# Patient Record
Sex: Male | Born: 1970
Health system: Southern US, Community
[De-identification: ages and names within clinical notes are randomized; demographics above are authoritative.]

## PROBLEM LIST (undated history)

## (undated) DIAGNOSIS — G4730 Sleep apnea, unspecified: Secondary | ICD-10-CM

## (undated) DIAGNOSIS — C801 Malignant (primary) neoplasm, unspecified: Secondary | ICD-10-CM

## (undated) DIAGNOSIS — Z972 Presence of dental prosthetic device (complete) (partial): Secondary | ICD-10-CM

## (undated) DIAGNOSIS — R972 Elevated prostate specific antigen [PSA]: Secondary | ICD-10-CM

## (undated) DIAGNOSIS — K429 Umbilical hernia without obstruction or gangrene: Secondary | ICD-10-CM

## (undated) DIAGNOSIS — Z125 Encounter for screening for malignant neoplasm of prostate: Secondary | ICD-10-CM

## (undated) HISTORY — PX: PROSTATE BIOPSY: SHX241

## (undated) HISTORY — PX: LIPOSUCTION EXTREMITIES: SUR830

## (undated) HISTORY — PX: SOFT TISSUE MASS EXCISION: SHX2419

---

## 2013-06-23 DIAGNOSIS — Q539 Undescended testicle, unspecified: Secondary | ICD-10-CM

## 2013-06-23 DIAGNOSIS — G4730 Sleep apnea, unspecified: Secondary | ICD-10-CM

## 2013-06-23 HISTORY — DX: Undescended testicle, unspecified: Q53.9

## 2013-06-23 HISTORY — DX: Sleep apnea, unspecified: G47.30

## 2013-11-30 ENCOUNTER — Ambulatory Visit (HOSPITAL_BASED_OUTPATIENT_CLINIC_OR_DEPARTMENT_OTHER): Payer: 59 | Attending: Family Medicine | Admitting: Radiology

## 2013-11-30 VITALS — Ht 72.0 in | Wt 335.0 lb

## 2013-11-30 DIAGNOSIS — G4730 Sleep apnea, unspecified: Principal | ICD-10-CM

## 2013-11-30 DIAGNOSIS — G4733 Obstructive sleep apnea (adult) (pediatric): Secondary | ICD-10-CM

## 2013-11-30 DIAGNOSIS — R0989 Other specified symptoms and signs involving the circulatory and respiratory systems: Secondary | ICD-10-CM | POA: Insufficient documentation

## 2013-11-30 DIAGNOSIS — R0609 Other forms of dyspnea: Secondary | ICD-10-CM | POA: Insufficient documentation

## 2013-11-30 DIAGNOSIS — G4710 Hypersomnia, unspecified: Secondary | ICD-10-CM | POA: Insufficient documentation

## 2013-12-03 DIAGNOSIS — G4730 Sleep apnea, unspecified: Secondary | ICD-10-CM

## 2013-12-03 DIAGNOSIS — G4710 Hypersomnia, unspecified: Secondary | ICD-10-CM

## 2013-12-03 NOTE — Sleep Study (Signed)
   NAME: Lawrence RussellSteven Dalton DATE OF BIRTH:  [birth year] MEDICAL RECORD NUMBER [id number]  LOCATION: [facility]  PHYSICIAN: Venicia Vandall D  DATE OF STUDY: 11/30/2013  SLEEP STUDY TYPE: Nocturnal Polysomnogram               REFERRING PHYSICIAN: Koirala, Dibas, MD  INDICATION FOR STUDY: Hypersomnia with sleep apnea  EPWORTH SLEEPINESS SCORE:   11/24 HEIGHT: 6' (182.9 cm)  WEIGHT: 335 lb (151.955 kg)    Body mass index is 45.42 kg/(m^2).  NECK SIZE: 19.5 in.  MEDICATIONS: Charted for review  SLEEP ARCHITECTURE: Split study protocol. During the diagnostic phase, total sleep time 125.5 minutes with sleep efficiency 83.9%. Stage I was 4.8%, stage II 79.3%, stage III absent, REM 15.9% of total sleep time. Sleep latency 17.5 minutes, REM latency 83.5 minutes, awake after sleep onset 6.5 minutes, arousal index 4.3, bedtime medication: None  RESPIRATORY DATA: Apnea hypopnea index (AHI) 23.9 per hour. 50 total events scored including 15 obstructive apneas, 1 central apnea, 34 hypopneas. All events were while supine. REM AHI 87 per hour. CPAP was titrated to 10 CWP, AHI 0 per hour. He wore a large fullface mask.  OXYGEN DATA: Moderate snoring before CPAP with oxygen desaturation to a nadir of 78% on room air. With CPAP control, snoring was prevented and mean oxygen saturation of 97.9% on room air.  CARDIAC DATA: Normal sinus rhythm  MOVEMENT/PARASOMNIA: No significant movement disturbance, bathroom x1  IMPRESSION/ RECOMMENDATION:   1) Moderate obstructive sleep apnea/hypopnea syndrome, AHI 23.9 per hour with supine events. Moderate snoring with oxygen desaturation to a nadir of 78% on room air. 2) Successful CPAP titration to 10 CWP, AHI 0 per hour. He wore a large ResMed AirFit F10 fullface mask with heated humidifier. Snoring was prevented and mean oxygen saturation held at 97.9% on room air with CPAP.   Signed Jetty Duhamellinton Nakenya Theall M.D. Waymon BudgeYOUNG,Shemia Bevel D Diplomate, American Board of  Sleep Medicine  ELECTRONICALLY SIGNED ON:  12/03/2013, 12:37 PM [facility] PH: [phone]   FX: [phone] ACCREDITED BY THE AMERICAN ACADEMY OF SLEEP MEDICINE

## 2014-01-17 ENCOUNTER — Institutional Professional Consult (permissible substitution): Payer: 59 | Admitting: Internal Medicine

## 2014-02-03 ENCOUNTER — Telehealth: Payer: Self-pay | Admitting: Internal Medicine

## 2014-02-03 NOTE — Telephone Encounter (Signed)
lmomtcb x1 

## 2014-02-03 NOTE — Telephone Encounter (Signed)
Called spoke with pt. Aware nothing sooner available. Nothing further needed

## 2014-03-02 ENCOUNTER — Encounter: Payer: Self-pay | Admitting: Internal Medicine

## 2014-03-02 ENCOUNTER — Encounter (INDEPENDENT_AMBULATORY_CARE_PROVIDER_SITE_OTHER): Payer: Self-pay

## 2014-03-02 ENCOUNTER — Ambulatory Visit (INDEPENDENT_AMBULATORY_CARE_PROVIDER_SITE_OTHER): Payer: 59 | Admitting: Internal Medicine

## 2014-03-02 VITALS — BP 130/82 | HR 86 | Ht 72.0 in | Wt 354.0 lb

## 2014-03-02 DIAGNOSIS — G4733 Obstructive sleep apnea (adult) (pediatric): Secondary | ICD-10-CM | POA: Insufficient documentation

## 2014-03-02 DIAGNOSIS — Z9989 Dependence on other enabling machines and devices: Secondary | ICD-10-CM | POA: Insufficient documentation

## 2014-03-02 NOTE — Patient Instructions (Signed)
Order- New DME, new CPAP 10 cwp, mask of choice, humidifier, supplies  Dx OSA  Please call as needed

## 2014-03-02 NOTE — Assessment & Plan Note (Signed)
Moderate obstructive sleep apnea with good response to CPAP during titration. He is looking forward to starting CPAP at home, recognizing he slept better during his study night. We had a careful discussion of CPAP physiology, medical concerns, importance of weight and responsibility to drive safely, treatment options  Plan-start CPAP at 10 CWP

## 2014-03-02 NOTE — Progress Notes (Signed)
03/02/14- 43 yoM never smoker referred courtesy of Dr Docia Chuck -pt had sleep study done at [facility] back june 2015.  pt c/o snoring at night NPSG 11/30/13- moderate OSA, AHI 23.9/ hr, CPAP to 10/ AHI 0, weight 335 lbs. 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 Avoids caffeine which over stimulates. No history ENT surgery, heart or lung disease. Father is on CPAP. Bedtime 9-11 PM, sleep latency 5-10 minutes, waking 2-3 times before up anywhere between 3 AM and 7 AM. Weight has gone up 20 pounds.  . Prior to Admission medications   Not on File   No past medical history on file. Past Surgical History  Procedure Laterality Date  . Fatty tumor removed      at age 83   Family History  Problem Relation Age of Onset  . Asthma Maternal Aunt   . Asthma Maternal Grandmother    History   Social History  . Marital Status: Married    Spouse Name: N/A    Number of Children: 0  . Years of Education: N/A   Occupational History  . Not on file.   Social History Main Topics  . Smoking status: Never Smoker   . Smokeless tobacco: Never Used  . Alcohol Use: Yes     Comment: social use  . Drug Use: No  . Sexual Activity: Not on file   Other Topics Concern  . Not on file   Social History Narrative  . No narrative on file   ROS-see HPI Constitutional:   No-   weight loss, night sweats, fevers, chills, +fatigue, lassitude. HEENT:   No-  headaches, difficulty swallowing, tooth/dental problems, sore throat,       No-  sneezing, itching, ear ache, nasal congestion, post nasal drip,  CV:  No-   chest pain, orthopnea, PND, swelling in lower extremities, anasarca,                                  dizziness, palpitations Resp: No-   shortness of breath with exertion or at rest.              No-   productive cough,  No non-productive cough,  No- coughing up of blood.              No-   change in color of mucus.  No- wheezing.   Skin: No-   rash or  lesions. GI:  No-   heartburn, indigestion, abdominal pain, nausea, vomiting, diarrhea,                 change in bowel habits, loss of appetite GU: No-   dysuria, change in color of urine, no urgency or frequency.  No- flank pain. MS:  No-   joint pain or swelling.  No- decreased range of motion.  No- back pain. Neuro-     nothing unusual Psych:  No- change in mood or affect. No depression or anxiety.  No memory loss.  OBJ- Physical Exam General- Alert, Oriented, Affect-appropriate, Distress- none acute, big man/ overweight Skin- rash-none, lesions- none, excoriation- none Lymphadenopathy- none Head- atraumatic            Eyes- Gross vision intact, PERRLA, conjunctivae and secretions clear            Ears- Hearing, canals-normal  Nose- Clear, no-Septal dev, mucus, polyps, erosion, perforation             Throat- Mallampati III , mucosa clear , drainage- none, tonsils+Present Neck- flexible , trachea midline, no stridor , thyroid nl, carotid no bruit Chest - symmetrical excursion , unlabored           Heart/CV- RRR , no murmur , no gallop  , no rub, nl s1 s2                           - JVD- none , edema- none, stasis changes- none, varices- none           Lung- clear to P&A, wheeze- none, cough- none , dullness-none, rub- none           Chest wall-  Abd- tender-no, distended-no, bowel sounds-present, HSM- no Br/ Gen/ Rectal- Not done, not indicated Extrem- cyanosis- none, clubbing, none, atrophy- none, strength- nl Neuro- grossly intact to observation

## 2014-03-03 ENCOUNTER — Telehealth: Payer: Self-pay | Admitting: Internal Medicine

## 2014-03-03 NOTE — Telephone Encounter (Signed)
lmtcb

## 2014-03-07 NOTE — Telephone Encounter (Signed)
Message Received: Today     Melissa Stenson Ronny Bacon, [name]            Katina Dung.  The pt's wife called our office on 9/11 to inquire about a CPAP order. We had no record of it so Barbara Cower looked in [place] and found the referral and saw that Bjorn Loser documented that the referral was sent to me. However, I never got an In [employer] from [place]. Barbara Cower went ahead and pulled the order and all supporting documentation so we could go ahead and service the pt. He just called in to let you guys know and make sure everything was ok.  Let me know if you have questions.  Thanks!  Melissa

## 2014-03-07 NOTE — Telephone Encounter (Signed)
Lawrence Dalton not available at this time; I have sent staff message to [facility] to assist in this matter.

## 2014-05-29 ENCOUNTER — Ambulatory Visit (INDEPENDENT_AMBULATORY_CARE_PROVIDER_SITE_OTHER): Payer: 59 | Admitting: Internal Medicine

## 2014-05-29 ENCOUNTER — Encounter: Payer: Self-pay | Admitting: Internal Medicine

## 2014-05-29 VITALS — BP 120/82 | HR 83 | Ht 72.0 in | Wt 363.0 lb

## 2014-05-29 DIAGNOSIS — G4733 Obstructive sleep apnea (adult) (pediatric): Secondary | ICD-10-CM

## 2014-05-29 NOTE — Patient Instructions (Signed)
You are off to a great start   We can continue CPAP 10/ Advanced. Call them with any maintenance problems.  Please call us if you need our help

## 2014-05-29 NOTE — Progress Notes (Signed)
03/02/14- 43 yoM never smoker referred courtesy of Dr Docia ChuckKoirala -pt [facility].  pt c/o snoring at night NPSG 11/30/13- moderate OSA, AHI 23.9/ hr, CPAP to 10/ AHI 0, weight 335 lbs. 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 Avoids caffeine which over stimulates. No history ENT surgery, heart or lung disease. Father is on CPAP. Bedtime 9-11 PM, sleep latency 5-10 minutes, waking 2-3 times before up anywhere between 3 AM and 7 AM. Weight has gone up 20 pounds.  05/29/14- 43 yoM never smoker followed for OSA (DOT), complicated by morbid obesity FOLLOW FOR:  OSA; setting is on 10cwp  Advanced; mask fitting fine; no complaints He admits he sleeps better with CPAP and is no longer being told that he snores. Trying to lose weight.  ROS-see HPI Constitutional:   No-   weight loss, night sweats, fevers, chills, +fatigue, lassitude. HEENT:   No-  headaches, difficulty swallowing, tooth/dental problems, sore throat,       No-  sneezing, itching, ear ache, nasal congestion, post nasal drip,  CV:  No-   chest pain, orthopnea, PND, swelling in lower extremities, anasarca,                                  dizziness, palpitations Resp: No-   shortness of breath with exertion or at rest.              No-   productive cough,  No non-productive cough,  No- coughing up of blood.              No-   change in color of mucus.  No- wheezing.   Skin: No-   rash or lesions. GI:  No-   heartburn, indigestion, abdominal pain, nausea, vomiting,  GU:  MS:  No-   joint pain or swelling. . Neuro-     nothing unusual Psych:  No- change in mood or affect. No depression or anxiety.  No memory loss.  OBJ- Physical Exam General- Alert, Oriented, Affect-appropriate, Distress- none acute, big man/ overweight Skin- rash-none, lesions- none, excoriation- none Lymphadenopathy- none Head- atraumatic            Eyes- Gross vision intact, PERRLA,  conjunctivae and secretions clear            Ears- Hearing, canals-normal            Nose- Clear, no-Septal dev, mucus, polyps, erosion, perforation             Throat- Mallampati III , mucosa clear , drainage- none, tonsils+Present Neck- flexible , trachea midline, no stridor , thyroid nl, carotid no bruit Chest - symmetrical excursion , unlabored           Heart/CV- RRR , no murmur , no gallop  , no rub, nl s1 s2                           - JVD- none , edema- none, stasis changes- none, varices- none           Lung- clear to P&A, wheeze- none, cough- none , dullness-none, rub- none           Chest wall-  Abd-  Br/ Gen/ Rectal- Not done, not indicated Extrem- cyanosis- none, clubbing, none, atrophy- none, strength- nl Neuro-  grossly intact to observation

## 2014-06-11 NOTE — Assessment & Plan Note (Signed)
Good compliance and control with CPAP 10/Advanced. Plan-strongly encourage his intent to lose weight using diet and exercise

## 2014-11-27 ENCOUNTER — Ambulatory Visit (INDEPENDENT_AMBULATORY_CARE_PROVIDER_SITE_OTHER): Payer: 59 | Admitting: Internal Medicine

## 2014-11-27 ENCOUNTER — Encounter (INDEPENDENT_AMBULATORY_CARE_PROVIDER_SITE_OTHER): Payer: Self-pay

## 2014-11-27 ENCOUNTER — Encounter: Payer: Self-pay | Admitting: Internal Medicine

## 2014-11-27 VITALS — BP 122/88 | HR 75 | Ht 72.0 in | Wt 353.0 lb

## 2014-11-27 DIAGNOSIS — E669 Obesity, unspecified: Secondary | ICD-10-CM | POA: Diagnosis not present

## 2014-11-27 DIAGNOSIS — G4733 Obstructive sleep apnea (adult) (pediatric): Secondary | ICD-10-CM | POA: Diagnosis not present

## 2014-11-27 NOTE — Patient Instructions (Addendum)
You are doing very well !  We can continue CPAP 10/ Advanced. If you decide you would like a script for a second machine, just let us know.   Please call if we can help

## 2014-11-27 NOTE — Progress Notes (Signed)
03/02/14- 43 yoM never smoker referred courtesy of Dr Docia ChuckKoirala -[facility].  pt c/o snoring at night NPSG 11/30/13- moderate OSA, AHI 23.9/ hr, CPAP to 10/ AHI 0, weight 335 lbs. 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 Avoids caffeine which over stimulates. No history ENT surgery, heart or lung disease. Father is on CPAP. Bedtime 9-11 PM, sleep latency 5-10 minutes, waking 2-3 times before up anywhere between 3 AM and 7 AM. Weight has gone up 20 pounds.  05/29/14- 43 yoM never smoker followed for OSA (DOT), complicated by morbid obesity FOLLOW FOR:  OSA; setting is on 10cwp  Advanced; mask fitting fine; no complaints He admits he sleeps better with CPAP and is no longer being told that he snores. Trying to lose weight.  11/27/14- 44 yoM never smoker followed for OSA (DOT), complicated by morbid obesity CPAP 10/ Advanced Reports: CPAP- nightly ~6-8 hrs no issues with mask; recieved a new one yesterday.Marland Kitchen.  He admits he sleeps much better with CPAP. Download confirms good compliance and control. He is considering getting a second machine to use for travel. We discussed travel size CPAP devices. Full face mask.   I confirmed he is on no medication except vitamins.  ROS-see HPI Constitutional:   No-   weight loss, night sweats, fevers, chills, +fatigue, lassitude. HEENT:   No-  headaches, difficulty swallowing, tooth/dental problems, sore throat,       No-  sneezing, itching, ear ache, nasal congestion, post nasal drip,  CV:  No-   chest pain, orthopnea, PND, swelling in lower extremities, anasarca,                                  dizziness, palpitations Resp: No-   shortness of breath with exertion or at rest.              No-   productive cough,  No non-productive cough,  No- coughing up of blood.              No-   change in color of mucus.  No- wheezing.   Skin: No-   rash or lesions. GI:  No-   heartburn,  indigestion, abdominal pain, nausea, vomiting,  GU:  MS:  No-   joint pain or swelling. . Neuro-     nothing unusual Psych:  No- change in mood or affect. No depression or anxiety.  No memory loss.  OBJ- Physical Exam General- Alert, Oriented, Affect-appropriate, Distress- none acute, big man/               overweight Skin- rash-none, lesions- none, excoriation- none Lymphadenopathy- none Head- atraumatic            Eyes- Gross vision intact, PERRLA, conjunctivae and secretions clear            Ears- Hearing, canals-normal            Nose- Clear, no-Septal dev, mucus, polyps, erosion, perforation             Throat- Mallampati III , mucosa clear , drainage- none, tonsils+Present Neck- flexible , trachea midline, no stridor , thyroid nl, carotid no bruit Chest - symmetrical excursion , unlabored           Heart/CV- RRR , no murmur , no gallop  , no rub, nl s1 s2                           -  JVD- none , edema- none, stasis changes- none, varices- none           Lung- clear to P&A, wheeze- none, cough- none , dullness-none, rub- none           Chest wall-  Abd-  Br/ Gen/ Rectal- Not done, not indicated Extrem- cyanosis- none, clubbing, none, atrophy- none, strength- nl Neuro- grossly intact to observation     

## 2014-11-28 DIAGNOSIS — E669 Obesity, unspecified: Secondary | ICD-10-CM | POA: Insufficient documentation

## 2014-11-28 NOTE — Assessment & Plan Note (Signed)
Big man, but too heavy for his frame. Weight loss would reduce risk of future health problems and improve current management of sleep apnea.

## 2014-11-28 NOTE — Assessment & Plan Note (Signed)
Download confirms good compliance and control. He understands the purpose of CPAP and is considering having a second machine is a spare for travel which is reasonable.

## 2014-12-08 ENCOUNTER — Encounter: Payer: Self-pay | Admitting: Internal Medicine

## 2015-07-02 DIAGNOSIS — Z0000 Encounter for general adult medical examination without abnormal findings: Secondary | ICD-10-CM | POA: Diagnosis not present

## 2015-07-02 DIAGNOSIS — E291 Testicular hypofunction: Secondary | ICD-10-CM | POA: Diagnosis not present

## 2015-07-16 DIAGNOSIS — G4733 Obstructive sleep apnea (adult) (pediatric): Secondary | ICD-10-CM | POA: Diagnosis not present

## 2015-07-30 DIAGNOSIS — Z0000 Encounter for general adult medical examination without abnormal findings: Secondary | ICD-10-CM | POA: Diagnosis not present

## 2015-07-30 DIAGNOSIS — E291 Testicular hypofunction: Secondary | ICD-10-CM | POA: Diagnosis not present

## 2015-08-08 DIAGNOSIS — E291 Testicular hypofunction: Secondary | ICD-10-CM | POA: Diagnosis not present

## 2015-09-19 DIAGNOSIS — E291 Testicular hypofunction: Secondary | ICD-10-CM | POA: Diagnosis not present

## 2016-03-12 ENCOUNTER — Ambulatory Visit: Payer: Self-pay | Admitting: Surgery

## 2016-03-12 NOTE — H&P (Signed)
Lawrence Dalton 03/12/2016 2:53 PM Location: [facility] Patient #: [id number] DOB: 10/22/1970 Married / Language: English / Race: Black or African American Male  History of Present Illness Lawrence Dalton(Lawrence Steinbach A. [name] MD; 03/12/2016 3:12 PM) Patient words: Patient sent at the request of Dr. Docia ChuckKoirala for umbilical hernia. It been present for the last 3 months. He noted noticed a bulge there 3 months ago at his umbilicus. It is sore to touch. He denies nausea or vomiting. It slides in and out. No redness or drainage noted.  The patient is a 45 year old male.   Other Problems [name], [name]; 03/12/2016 2:53 PM) Sleep Apnea Umbilical Hernia Repair  Past Surgical History [name], [name]; 03/12/2016 2:53 PM) No pertinent past surgical history  Diagnostic Studies History [name], [name]; 03/12/2016 2:53 PM) Colonoscopy never  Allergies [name], [name]; 03/12/2016 2:53 PM) No Known Drug Allergies 03/12/2016  Medication History [name], [name]; 03/12/2016 2:53 PM) Testosterone Cypionate (100MG /ML Solution, Intramuscular) Active. Medications Reconciled  Social History [name], [place]; 03/12/2016 2:53 PM) Alcohol use Occasional alcohol use. Caffeine use Coffee. No drug use Tobacco use Never smoker.  Family History [name], [place]; 03/12/2016 2:53 PM) Alcohol Abuse Father. Breast Cancer Family Members In General. Diabetes Mellitus Family Members In General. Heart Disease Family Members In General.     Review of Systems [name] [name]; 03/12/2016 2:53 PM) General Not Present- Appetite Loss, Chills, Fatigue, Fever, Night Sweats, Weight Gain and Weight Loss. Skin Not Present- Change in Wart/Mole, Dryness, Hives, Jaundice, New Lesions, Non-Healing Wounds, Rash and Ulcer. HEENT Present- Wears glasses/contact lenses. Not Present- Earache, Hearing Loss, Hoarseness, Nose Bleed, Oral Ulcers, Ringing in the Ears, Seasonal Allergies, Sinus Pain, Sore Throat, Visual  Disturbances and Yellow Eyes. Respiratory Not Present- Bloody sputum, Chronic Cough, Difficulty Breathing, Snoring and Wheezing. Breast Not Present- Breast Mass, Breast Pain, Nipple Discharge and Skin Changes. Cardiovascular Not Present- Chest Pain, Difficulty Breathing Lying Down, Leg Cramps, Palpitations, Rapid Heart Rate, Shortness of Breath and Swelling of Extremities. Gastrointestinal Not Present- Abdominal Pain, Bloating, Bloody Stool, Change in Bowel Habits, Chronic diarrhea, Constipation, Difficulty Swallowing, Excessive gas, Gets full quickly at meals, Hemorrhoids, Indigestion, Nausea, Rectal Pain and Vomiting. Male Genitourinary Not Present- Blood in Urine, Change in Urinary Stream, Frequency, Impotence, Nocturia, Painful Urination, Urgency and Urine Leakage. Musculoskeletal Not Present- Back Pain, Joint Pain, Joint Stiffness, Muscle Pain, Muscle Weakness and Swelling of Extremities. Neurological Not Present- Decreased Memory, Fainting, Headaches, Numbness, Seizures, Tingling, Tremor, Trouble walking and Weakness. Psychiatric Not Present- Anxiety, Bipolar, Change in Sleep Pattern, Depression, Fearful and Frequent crying. Endocrine Not Present- Cold Intolerance, Excessive Hunger, Hair Changes, Heat Intolerance, Hot flashes and New Diabetes. Hematology Not Present- Blood Thinners, Easy Bruising, Excessive bleeding, Gland problems, HIV and Persistent Infections.  Vitals [name] [name]; 03/12/2016 2:54 PM) 03/12/2016 2:53 PM Weight: 351 lb Height: 72in Body Surface Area: 2.71 m Body Mass Index: 47.6 kg/m  Temp.: 98.66F(Temporal)  Pulse: 91 (Regular)  BP: 138/78 (Sitting, Left Arm, Standard)      Physical Exam (Lenaya Pietsch A. Aydian Dimmick MD; 03/12/2016 3:14 PM)  Chest and Lung Exam Chest and lung exam reveals -quiet, even and easy respiratory effort with no use of accessory muscles and on auscultation, normal breath sounds, no adventitious sounds and normal vocal  resonance. Inspection Chest Wall - Normal. Back - normal.  Cardiovascular Cardiovascular examination reveals -normal heart sounds, regular rate and rhythm with no murmurs and normal pedal pulses bilaterally.  Abdomen Inspection Skin - Scar - no  surgical scars. Hernias - Ventral - Reducible. Umbilical hernia - Reducible. Palpation/Percussion Palpation and Percussion of the abdomen reveal - Soft, Non Tender, No Rebound tenderness, No Rigidity (guarding) and No hepatosplenomegaly. Auscultation Auscultation of the abdomen reveals - Bowel sounds normal.  Neurologic Neurologic evaluation reveals -alert and oriented x 3 with no impairment of recent or remote memory. Mental Status-Normal.  Musculoskeletal Normal Exam - Left-Upper Extremity Strength Normal and Lower Extremity Strength Normal. Normal Exam - Right-Upper Extremity Strength Normal, Lower Extremity Weakness.    Assessment & Plan (Marqueta Pulley A. Jakirah Zaun MD; 03/12/2016 3:13 PM)  UMBILICAL HERNIA WITHOUT OBSTRUCTION AND WITHOUT GANGRENE (K42.9) Impression: Discussed repair versus observation. Discussed the need for mesh use in the potential complications of that with him today.  The risk of hernia repair include bleeding, infection, organ injury, bowel injury, bladder injury, nerve injury recurrent hernia, blood clots, worsening of underlying condition, chronic pain, mesh use, open surgery, death, and the need for other operattions. Pt agrees to proceed  Current Plans You are being scheduled for surgery - Our schedulers will call you.  You should hear from our office's scheduling department within 5 working days about the location, date, and time of surgery. We try to make accommodations for patient's preferences in scheduling surgery, but sometimes the OR schedule or the surgeon's schedule prevents us from making those accommodations.  If you have not heard from our office [phone]) in 5 working days, call the office  and ask for your surgeon's nurse.  If you have other questions about your diagnosis, plan, or surgery, call the office and ask for your surgeon's nurse.  Pt Education - Pamphlet Given - Hernia Surgery: discussed with patient and provided information. The anatomy & physiology of the abdominal wall was discussed. The pathophysiology of hernias was discussed. Natural history risks without surgery including progeressive enlargement, pain, incarceration, & strangulation was discussed. Contributors to complications such as smoking, obesity, diabetes, prior surgery, etc were discussed.  I feel the risks of no intervention will lead to serious problems that outweigh the operative risks; therefore, I recommended surgery to reduce and repair the hernia. I explained laparoscopic techniques with possible need for an open approach. I noted the probable use of mesh to patch and/or buttress the hernia repair  Risks such as bleeding, infection, abscess, need for further treatment, heart attack, death, and other risks were discussed. I noted a good likelihood this will help address the problem. Goals of post-operative recovery were discussed as well. Possibility that this will not correct all symptoms was explained. I stressed the importance of low-impact activity, aggressive pain control, avoiding constipation, & not pushing through pain to minimize risk of post-operative chronic pain or injury. Possibility of reherniation especially with smoking, obesity, diabetes, immunosuppression, and other health conditions was discussed. We will work to minimize complications.  An educational handout further explaining the pathology & treatment options was given as well. Questions were answered. The patient expresses understanding & wishes to proceed with surgery.

## 2016-03-23 DIAGNOSIS — K429 Umbilical hernia without obstruction or gangrene: Secondary | ICD-10-CM

## 2016-03-23 HISTORY — DX: Umbilical hernia without obstruction or gangrene: K42.9

## 2016-04-03 ENCOUNTER — Encounter (HOSPITAL_BASED_OUTPATIENT_CLINIC_OR_DEPARTMENT_OTHER): Payer: Self-pay | Admitting: *Deleted

## 2016-04-03 NOTE — Pre-Procedure Instructions (Signed)
To come for CBC, diff, CMET and anesthesia airway evaluation.

## 2016-04-07 ENCOUNTER — Encounter (HOSPITAL_BASED_OUTPATIENT_CLINIC_OR_DEPARTMENT_OTHER)
Admission: RE | Admit: 2016-04-07 | Discharge: 2016-04-07 | Disposition: A | Discharge status: Home / Self Care | Payer: 59 | Source: Ambulatory Visit | Attending: Surgery | Admitting: Surgery

## 2016-04-07 DIAGNOSIS — Z01812 Encounter for preprocedural laboratory examination: Secondary | ICD-10-CM | POA: Insufficient documentation

## 2016-04-07 DIAGNOSIS — K429 Umbilical hernia without obstruction or gangrene: Secondary | ICD-10-CM | POA: Insufficient documentation

## 2016-04-07 LAB — CBC WITH DIFFERENTIAL/PLATELET
BASOS ABS: 0 10*3/uL (ref 0.0–0.1)
BASOS PCT: 1 %
Eosinophils Absolute: 0.2 10*3/uL (ref 0.0–0.7)
Eosinophils Relative: 3 %
HEMATOCRIT: 48.1 % (ref 39.0–52.0)
HEMOGLOBIN: 16.3 g/dL (ref 13.0–17.0)
Lymphocytes Relative: 45 %
Lymphs Abs: 2.3 10*3/uL (ref 0.7–4.0)
MCH: 28.2 pg (ref 26.0–34.0)
MCHC: 33.9 g/dL (ref 30.0–36.0)
MCV: 83.4 fL (ref 78.0–100.0)
MONOS PCT: 11 %
Monocytes Absolute: 0.6 10*3/uL (ref 0.1–1.0)
NEUTROS ABS: 2.1 10*3/uL (ref 1.7–7.7)
NEUTROS PCT: 40 %
Platelets: 200 10*3/uL (ref 150–400)
RBC: 5.77 MIL/uL (ref 4.22–5.81)
RDW: 15.3 % (ref 11.5–15.5)
WBC: 5.1 10*3/uL (ref 4.0–10.5)

## 2016-04-07 LAB — COMPREHENSIVE METABOLIC PANEL
ALBUMIN: 3.7 g/dL (ref 3.5–5.0)
ALK PHOS: 54 U/L (ref 38–126)
ALT: 19 U/L (ref 17–63)
AST: 20 U/L (ref 15–41)
Anion gap: 7 (ref 5–15)
BILIRUBIN TOTAL: 0.6 mg/dL (ref 0.3–1.2)
BUN: 12 mg/dL (ref 6–20)
CALCIUM: 9 mg/dL (ref 8.9–10.3)
CO2: 24 mmol/L (ref 22–32)
Chloride: 104 mmol/L (ref 101–111)
Creatinine, Ser: 1.26 mg/dL — ABNORMAL HIGH (ref 0.61–1.24)
GFR calc Af Amer: >60 mL/min (ref >60)
GFR calc non Af Amer: >60 mL/min (ref >60)
GLUCOSE: 77 mg/dL (ref 65–99)
Potassium: 4.8 mmol/L (ref 3.5–5.1)
SODIUM: 135 mmol/L (ref 135–145)
TOTAL PROTEIN: 7.1 g/dL (ref 6.5–8.1)

## 2016-04-07 NOTE — Progress Notes (Signed)
Anesthesia consult by Dr Michelle Piperssey no further treatment needed.  Pt instructed to bring cpap with him day of surgery

## 2016-04-10 ENCOUNTER — Ambulatory Visit (HOSPITAL_BASED_OUTPATIENT_CLINIC_OR_DEPARTMENT_OTHER): Payer: 59 | Admitting: Certified Registered"

## 2016-04-10 ENCOUNTER — Encounter (HOSPITAL_BASED_OUTPATIENT_CLINIC_OR_DEPARTMENT_OTHER): Payer: Self-pay | Admitting: *Deleted

## 2016-04-10 ENCOUNTER — Ambulatory Visit (HOSPITAL_BASED_OUTPATIENT_CLINIC_OR_DEPARTMENT_OTHER)
Admission: RE | Admit: 2016-04-10 | Discharge: 2016-04-10 | Disposition: A | Discharge status: Home / Self Care | Payer: 59 | Source: Ambulatory Visit | Attending: Surgery | Admitting: Surgery

## 2016-04-10 ENCOUNTER — Telehealth: Payer: Self-pay | Admitting: General Surgery

## 2016-04-10 ENCOUNTER — Encounter (HOSPITAL_BASED_OUTPATIENT_CLINIC_OR_DEPARTMENT_OTHER)
Admission: RE | Disposition: A | Discharge status: Home / Self Care | Payer: Self-pay | Source: Ambulatory Visit | Attending: Surgery

## 2016-04-10 DIAGNOSIS — Z833 Family history of diabetes mellitus: Secondary | ICD-10-CM | POA: Insufficient documentation

## 2016-04-10 DIAGNOSIS — Z79899 Other long term (current) drug therapy: Secondary | ICD-10-CM | POA: Diagnosis not present

## 2016-04-10 DIAGNOSIS — Z811 Family history of alcohol abuse and dependence: Secondary | ICD-10-CM | POA: Diagnosis not present

## 2016-04-10 DIAGNOSIS — Z803 Family history of malignant neoplasm of breast: Secondary | ICD-10-CM | POA: Diagnosis not present

## 2016-04-10 DIAGNOSIS — K420 Umbilical hernia with obstruction, without gangrene: Secondary | ICD-10-CM | POA: Diagnosis not present

## 2016-04-10 DIAGNOSIS — Z6841 Body Mass Index (BMI) 40.0 and over, adult: Secondary | ICD-10-CM | POA: Diagnosis not present

## 2016-04-10 DIAGNOSIS — Z8249 Family history of ischemic heart disease and other diseases of the circulatory system: Secondary | ICD-10-CM | POA: Diagnosis not present

## 2016-04-10 DIAGNOSIS — G4730 Sleep apnea, unspecified: Secondary | ICD-10-CM | POA: Diagnosis not present

## 2016-04-10 HISTORY — PX: UMBILICAL HERNIA REPAIR: SHX196

## 2016-04-10 HISTORY — DX: Presence of dental prosthetic device (complete) (partial): Z97.2

## 2016-04-10 HISTORY — PX: INSERTION OF MESH: SHX5868

## 2016-04-10 HISTORY — DX: Umbilical hernia without obstruction or gangrene: K42.9

## 2016-04-10 HISTORY — DX: Sleep apnea, unspecified: G47.30

## 2016-04-10 SURGERY — REPAIR, HERNIA, UMBILICAL, ADULT
Anesthesia: General | Site: Abdomen

## 2016-04-10 MED ORDER — CHLORHEXIDINE GLUCONATE CLOTH 2 % EX PADS: 6.0000 | MEDICATED_PAD | Freq: Once | CUTANEOUS | Status: DC

## 2016-04-10 MED ORDER — DEXAMETHASONE SODIUM PHOSPHATE 4 MG/ML IJ SOLN
INTRAMUSCULAR | Status: DC | PRN
  Administered 2016-04-10: 10 mg via INTRAVENOUS

## 2016-04-10 MED ORDER — CELECOXIB 400 MG PO CAPS
400.0000 mg | ORAL_CAPSULE | ORAL | Status: AC
  Administered 2016-04-10: 400 mg via ORAL

## 2016-04-10 MED ORDER — MIDAZOLAM HCL 5 MG/5ML IJ SOLN
INTRAMUSCULAR | Status: DC | PRN
  Administered 2016-04-10: 2 mg via INTRAVENOUS

## 2016-04-10 MED ORDER — 0.9 % SODIUM CHLORIDE (POUR BTL) OPTIME
TOPICAL | Status: DC | PRN
  Administered 2016-04-10: 200 mL

## 2016-04-10 MED ORDER — GABAPENTIN 300 MG PO CAPS
300.0000 mg | ORAL_CAPSULE | ORAL | Status: AC
  Administered 2016-04-10: 300 mg via ORAL

## 2016-04-10 MED ORDER — LACTATED RINGERS IV SOLN
INTRAVENOUS | Status: DC
  Administered 2016-04-10 (×2): via INTRAVENOUS

## 2016-04-10 MED ORDER — CEFAZOLIN SODIUM-DEXTROSE 2-4 GM/100ML-% IV SOLN
INTRAVENOUS | Status: AC
  Filled 2016-04-10: qty 200

## 2016-04-10 MED ORDER — ROCURONIUM BROMIDE 100 MG/10ML IV SOLN
INTRAVENOUS | Status: DC | PRN
  Administered 2016-04-10: 35 mg via INTRAVENOUS

## 2016-04-10 MED ORDER — PROPOFOL 10 MG/ML IV BOLUS
INTRAVENOUS | Status: AC
  Filled 2016-04-10: qty 20

## 2016-04-10 MED ORDER — ACETAMINOPHEN 500 MG PO TABS
ORAL_TABLET | ORAL | Status: AC
  Filled 2016-04-10: qty 2

## 2016-04-10 MED ORDER — SUGAMMADEX SODIUM 500 MG/5ML IV SOLN
INTRAVENOUS | Status: DC | PRN
  Administered 2016-04-10: 300 mg via INTRAVENOUS

## 2016-04-10 MED ORDER — CELECOXIB 200 MG PO CAPS
ORAL_CAPSULE | ORAL | Status: AC
  Filled 2016-04-10: qty 2

## 2016-04-10 MED ORDER — PROMETHAZINE HCL 25 MG/ML IJ SOLN: 6.2500 mg | INTRAMUSCULAR | Status: DC | PRN

## 2016-04-10 MED ORDER — FENTANYL CITRATE (PF) 100 MCG/2ML IJ SOLN
INTRAMUSCULAR | Status: AC
  Filled 2016-04-10: qty 2

## 2016-04-10 MED ORDER — BUPIVACAINE-EPINEPHRINE 0.25% -1:200000 IJ SOLN
INTRAMUSCULAR | Status: DC | PRN
Start: 2016-04-10 — End: 2016-04-10
  Administered 2016-04-10: 10 mL

## 2016-04-10 MED ORDER — DEXTROSE 5 % IV SOLN
3.0000 g | INTRAVENOUS | Status: AC
  Administered 2016-04-10: 3 g via INTRAVENOUS

## 2016-04-10 MED ORDER — FENTANYL CITRATE (PF) 100 MCG/2ML IJ SOLN
INTRAMUSCULAR | Status: DC | PRN
  Administered 2016-04-10 (×2): 100 ug via INTRAVENOUS

## 2016-04-10 MED ORDER — ACETAMINOPHEN 500 MG PO TABS
1000.0000 mg | ORAL_TABLET | ORAL | Status: AC
  Administered 2016-04-10: 1000 mg via ORAL

## 2016-04-10 MED ORDER — PROPOFOL 10 MG/ML IV BOLUS
INTRAVENOUS | Status: DC | PRN
  Administered 2016-04-10: 200 mg via INTRAVENOUS

## 2016-04-10 MED ORDER — GABAPENTIN 300 MG PO CAPS
ORAL_CAPSULE | ORAL | Status: AC
  Filled 2016-04-10: qty 1

## 2016-04-10 MED ORDER — ONDANSETRON HCL 4 MG/2ML IJ SOLN
INTRAMUSCULAR | Status: DC | PRN
  Administered 2016-04-10: 4 mg via INTRAVENOUS

## 2016-04-10 MED ORDER — HYDROMORPHONE HCL 1 MG/ML IJ SOLN
INTRAMUSCULAR | Status: AC
  Filled 2016-04-10: qty 1

## 2016-04-10 MED ORDER — HYDROMORPHONE HCL 1 MG/ML IJ SOLN
0.2500 mg | INTRAMUSCULAR | Status: DC | PRN
  Administered 2016-04-10 (×2): 0.5 mg via INTRAVENOUS

## 2016-04-10 MED ORDER — LIDOCAINE HCL (CARDIAC) 20 MG/ML IV SOLN
INTRAVENOUS | Status: DC | PRN
  Administered 2016-04-10: 100 mg via INTRAVENOUS

## 2016-04-10 MED ORDER — MIDAZOLAM HCL 2 MG/2ML IJ SOLN
INTRAMUSCULAR | Status: AC
  Filled 2016-04-10: qty 2

## 2016-04-10 MED ORDER — HYDROCODONE-ACETAMINOPHEN 10-325 MG PO TABS: 1.0000 | ORAL_TABLET | Freq: Four times a day (QID) | ORAL | 0 refills | Status: AC | PRN

## 2016-04-10 MED ORDER — SUCCINYLCHOLINE CHLORIDE 20 MG/ML IJ SOLN
INTRAMUSCULAR | Status: DC | PRN
  Administered 2016-04-10: 140 mg via INTRAVENOUS

## 2016-04-10 SURGICAL SUPPLY — 47 items
BENZOIN TINCTURE PRP APPL 2/3 (GAUZE/BANDAGES/DRESSINGS) IMPLANT
BLADE CLIPPER SURG (BLADE) ×2 IMPLANT
BLADE SURG 10 STRL SS (BLADE) IMPLANT
BLADE SURG 15 STRL LF DISP TIS (BLADE) ×1 IMPLANT
BLADE SURG 15 STRL SS (BLADE) ×1
CANISTER SUCT 1200ML W/VALVE (MISCELLANEOUS) IMPLANT
CHLORAPREP W/TINT 26ML (MISCELLANEOUS) ×2 IMPLANT
COVER BACK TABLE 60X90IN (DRAPES) ×2 IMPLANT
COVER MAYO STAND STRL (DRAPES) ×2 IMPLANT
DECANTER SPIKE VIAL GLASS SM (MISCELLANEOUS) IMPLANT
DERMABOND ADVANCED (GAUZE/BANDAGES/DRESSINGS) ×1
DERMABOND ADVANCED .7 DNX12 (GAUZE/BANDAGES/DRESSINGS) ×1 IMPLANT
DRAPE LAPAROTOMY TRNSV 102X78 (DRAPE) ×2 IMPLANT
DRAPE UTILITY XL STRL (DRAPES) ×2 IMPLANT
DRSG TEGADERM 4X4.75 (GAUZE/BANDAGES/DRESSINGS) IMPLANT
ELECT COATED BLADE 2.86 ST (ELECTRODE) ×2 IMPLANT
ELECT REM PT RETURN 9FT ADLT (ELECTROSURGICAL) ×2
ELECTRODE REM PT RTRN 9FT ADLT (ELECTROSURGICAL) ×1 IMPLANT
GLOVE BIOGEL PI IND STRL 7.0 (GLOVE) ×1 IMPLANT
GLOVE BIOGEL PI IND STRL 8 (GLOVE) ×1 IMPLANT
GLOVE BIOGEL PI INDICATOR 7.0 (GLOVE) ×1
GLOVE BIOGEL PI INDICATOR 8 (GLOVE) ×1
GLOVE ECLIPSE 6.5 STRL STRAW (GLOVE) ×2 IMPLANT
GLOVE ECLIPSE 8.0 STRL XLNG CF (GLOVE) ×2 IMPLANT
GOWN STRL REUS W/ TWL LRG LVL3 (GOWN DISPOSABLE) ×2 IMPLANT
GOWN STRL REUS W/TWL LRG LVL3 (GOWN DISPOSABLE) ×2
MESH VENTRALEX ST 1-7/10 CRC S (Mesh General) ×2 IMPLANT
NEEDLE HYPO 22GX1.5 SAFETY (NEEDLE) IMPLANT
NEEDLE HYPO 25X1 1.5 SAFETY (NEEDLE) ×2 IMPLANT
NS IRRIG 1000ML POUR BTL (IV SOLUTION) ×2 IMPLANT
PACK BASIN DAY SURGERY FS (CUSTOM PROCEDURE TRAY) ×2 IMPLANT
PENCIL BUTTON HOLSTER BLD 10FT (ELECTRODE) ×2 IMPLANT
SLEEVE SCD COMPRESS KNEE MED (MISCELLANEOUS) ×2 IMPLANT
STAPLER VISISTAT 35W (STAPLE) IMPLANT
STRIP CLOSURE SKIN 1/2X4 (GAUZE/BANDAGES/DRESSINGS) IMPLANT
SUT MON AB 4-0 PC3 18 (SUTURE) ×2 IMPLANT
SUT NOVA NAB DX-16 0-1 5-0 T12 (SUTURE) ×2 IMPLANT
SUT NOVA NAB GS-22 2 0 T19 (SUTURE) IMPLANT
SUT SILK 3 0 SH 30 (SUTURE) IMPLANT
SUT VIC AB 2-0 SH 27 (SUTURE) ×1
SUT VIC AB 2-0 SH 27XBRD (SUTURE) ×1 IMPLANT
SUT VICRYL 3-0 CR8 SH (SUTURE) ×2 IMPLANT
SYR CONTROL 10ML LL (SYRINGE) ×2 IMPLANT
TOWEL OR 17X24 6PK STRL BLUE (TOWEL DISPOSABLE) ×2 IMPLANT
TOWEL OR NON WOVEN STRL DISP B (DISPOSABLE) IMPLANT
TUBE CONNECTING 20X1/4 (TUBING) IMPLANT
YANKAUER SUCT BULB TIP NO VENT (SUCTIONS) IMPLANT

## 2016-04-10 NOTE — Transfer of Care (Signed)
Immediate Anesthesia Transfer of Care Note  Patient: Lawrence Dalton  Procedure(s) Performed: Procedure(s): HERNIA REPAIR UMBILICAL ADULT (N/A) INSERTION OF MESH (N/A)  Patient Location: PACU  Anesthesia Type:General  Level of Consciousness: sedated  Airway & Oxygen Therapy: Patient Spontanous Breathing and Patient connected to face mask oxygen  Post-op Assessment: Report given to RN and Post -op Vital signs reviewed and stable  Post vital signs: Reviewed and stable  Last Vitals:  Vitals:   04/10/16 0751  BP: 128/76  Pulse: 70  Resp: 20  Temp: 36.9 C    Last Pain:  Vitals:   04/10/16 0751  TempSrc: Oral         Complications: No apparent anesthesia complications

## 2016-04-10 NOTE — Anesthesia Preprocedure Evaluation (Addendum)
Anesthesia Evaluation  Patient identified by MRN, date of birth, ID band Patient awake    Reviewed: Allergy & Precautions, NPO status , Patient's Chart, lab work & pertinent test results  History of Anesthesia Complications Negative for: history of anesthetic complications  Airway Mallampati: III  TM Distance: >3 FB Neck ROM: Full    Dental no notable dental hx. (+) Dental Advisory Given   Pulmonary sleep apnea and Oxygen sleep apnea ,    Pulmonary exam normal        Cardiovascular negative cardio ROS Normal cardiovascular exam     Neuro/Psych negative neurological ROS  negative psych ROS   GI/Hepatic negative GI ROS, Neg liver ROS,   Endo/Other  Morbid obesity  Renal/GU      Musculoskeletal   Abdominal (+) + obese,   Peds  Hematology   Anesthesia Other Findings   Reproductive/Obstetrics                           Anesthesia Physical Anesthesia Plan  ASA: III  Anesthesia Plan: General   Post-op Pain Management:    Induction: Intravenous  Airway Management Planned: LMA and Oral ETT  Additional Equipment:   Intra-op Plan:   Post-operative Plan: Extubation in OR  Informed Consent: I have reviewed the patients History and Physical, chart, labs and discussed the procedure including the risks, benefits and alternatives for the proposed anesthesia with the patient or authorized representative who has indicated his/her understanding and acceptance.   Dental advisory given  Plan Discussed with: CRNA, Anesthesiologist and Surgeon  Anesthesia Plan Comments:        Anesthesia Quick Evaluation

## 2016-04-10 NOTE — Discharge Instructions (Signed)
[facility] _______[facility] ° °UMBILICAL OR INGUINAL HERNIA REPAIR: POST OP INSTRUCTIONS ° °Always review your discharge instruction sheet given to you by the facility where your surgery was performed. °IF YOU HAVE DISABILITY OR FAMILY LEAVE FORMS, YOU MUST BRING THEM TO THE OFFICE FOR PROCESSING.   °DO NOT GIVE THEM TO YOUR DOCTOR. ° °1. A  prescription for pain medication may be given to you upon discharge.  Take your pain medication as prescribed, if needed.  If narcotic pain medicine is not needed, then you may take acetaminophen (Tylenol) or ibuprofen (Advil) as needed. °2. Take your usually prescribed medications unless otherwise directed. °If you need a refill on your pain medication, please contact your pharmacy.  They will contact our office to request authorization. Prescriptions will not be filled after 5 pm or on week-ends. °3. You should follow a light diet the first 24 hours after arrival home, such as soup and crackers, etc.  Be sure to include lots of fluids daily.  Resume your normal diet the day after surgery. °4.Most patients will experience some swelling and bruising around the umbilicus or in the groin and scrotum.  Ice packs and reclining will help.  Swelling and bruising can take several days to resolve.  °6. It is common to experience some constipation if taking pain medication after surgery.  Increasing fluid intake and taking a stool softener (such as Colace) will usually help or prevent this problem from occurring.  A mild laxative (Milk of Magnesia or Miralax) should be taken according to package directions if there are no bowel movements after 48 hours. °7. Unless discharge instructions indicate otherwise, you may remove your bandages 24-48 hours after surgery, and you may shower at that time.  You may have steri-strips (small skin tapes) in place directly over the incision.  These strips should be left on the skin for 7-10 days.  If your surgeon used skin glue on the  incision, you may shower in 24 hours.  The glue will flake off over the next 2-3 weeks.  Any sutures or staples will be removed at the office during your follow-up visit. °8. ACTIVITIES:  You may resume regular (light) daily activities beginning the next day--such as daily self-care, walking, climbing stairs--gradually increasing activities as tolerated.  You may have sexual intercourse when it is comfortable.  Refrain from any heavy lifting or straining until approved by your doctor. ° °a.You may drive when you are no longer taking prescription pain medication, you can comfortably wear a seatbelt, and you can safely maneuver your car and apply brakes. °b.RETURN TO WORK:   °_____________________________________________ ° °9.You should see your doctor in the office for a follow-up appointment approximately 2-3 weeks after your surgery.  Make sure that you call for this appointment within a day or two after you arrive home to insure a convenient appointment time. °10.OTHER INSTRUCTIONS: _________________________ °   _____________________________________ ° °WHEN TO CALL YOUR DOCTOR: °1. Fever over 101.0 °2. Inability to urinate °3. Nausea and/or vomiting °4. Extreme swelling or bruising °5. Continued bleeding from incision. °6. Increased pain, redness, or drainage from the incision ° °The clinic staff is available to answer your questions during regular business hours.  Please don’t hesitate to call and ask to speak to one of the nurses for clinical concerns.  If you have a medical emergency, go to the nearest emergency room or call 911.  A surgeon from [facility] is always on call at the hospital ° ° °  [address], [address], [place], [place]  [address] ? ° [address] [address], [place], [place]   [address] °[phone] ? [phone] ? FAX [phone] °Web site: [email or web address] ° ° ° ° ° °Post Anesthesia Home Care Instructions ° °Activity: °Get plenty of rest for the remainder of the  day. A responsible adult should stay with you for 24 hours following the procedure.  °For the next 24 hours, DO NOT: °-Drive a car °-Operate machinery °-Drink alcoholic beverages °-Take any medication unless instructed by your physician °-Make any legal decisions or sign important papers. ° °Meals: °Start with liquid foods such as gelatin or soup. Progress to regular foods as tolerated. Avoid greasy, spicy, heavy foods. If nausea and/or vomiting occur, drink only clear liquids until the nausea and/or vomiting subsides. Call your physician if vomiting continues. ° °Special Instructions/Symptoms: °Your throat may feel dry or sore from the anesthesia or the breathing tube placed in your throat during surgery. If this causes discomfort, gargle with warm salt water. The discomfort should disappear within 24 hours. ° °If you had a scopolamine patch placed behind your ear for the management of post- operative nausea and/or vomiting: ° °1. The medication in the patch is effective for 72 hours, after which it should be removed.  Wrap patch in a tissue and discard in the trash. Wash hands thoroughly with soap and water. °2. You may remove the patch earlier than 72 hours if you experience unpleasant side effects which may include dry mouth, dizziness or visual disturbances. °3. Avoid touching the patch. Wash your hands with soap and water after contact with the patch. °  ° °

## 2016-04-10 NOTE — H&P (View-Only) (Signed)
Lawrence Dalton 03/12/2016 2:53 PM Location: [facility] Patient #: [id number] DOB: 10/22/1970 Married / Language: English / Race: Black or African American Male  History of Present Illness [name] MD; 03/12/2016 3:12 PM) Patient words: Patient sent at the request of Dr. Docia ChuckKoirala for umbilical hernia. It been present for the last 3 months. He noted noticed a bulge there 3 months ago at his umbilicus. It is sore to touch. He denies nausea or vomiting. It slides in and out. No redness or drainage noted.  The patient is a 45 year old male.   Other Problems [name], [name]; 03/12/2016 2:53 PM) Sleep Apnea Umbilical Hernia Repair  Past Surgical History [name], [name]; 03/12/2016 2:53 PM) No pertinent past surgical history  Diagnostic Studies History [name], [name]; 03/12/2016 2:53 PM) Colonoscopy never  Allergies [name], [name]; 03/12/2016 2:53 PM) No Known Drug Allergies 03/12/2016  Medication History [name], [name]; 03/12/2016 2:53 PM) Testosterone Cypionate (100MG /ML Solution, Intramuscular) Active. Medications Reconciled  Social History [name][place]; 03/12/2016 2:53 PM) Alcohol use Occasional alcohol use. Caffeine use Coffee. No drug use Tobacco use Never smoker.  Family History [name], [place]; 03/12/2016 2:53 PM) Alcohol Abuse Father. Breast Cancer Family Members In General. Diabetes Mellitus Family Members In General. Heart Disease Family Members In General.     Review of Systems [name] [name]; 03/12/2016 2:53 PM) General Not Present- Appetite Loss, Chills, Fatigue, Fever, Night Sweats, Weight Gain and Weight Loss. Skin Not Present- Change in Wart/Mole, Dryness, Hives, Jaundice, New Lesions, Non-Healing Wounds, Rash and Ulcer. HEENT Present- Wears glasses/contact lenses. Not Present- Earache, Hearing Loss, Hoarseness, Nose Bleed, Oral Ulcers, Ringing in the Ears, Seasonal Allergies, Sinus Pain, Sore Throat, Visual  Disturbances and Yellow Eyes. Respiratory Not Present- Bloody sputum, Chronic Cough, Difficulty Breathing, Snoring and Wheezing. Breast Not Present- Breast Mass, Breast Pain, Nipple Discharge and Skin Changes. Cardiovascular Not Present- Chest Pain, Difficulty Breathing Lying Down, Leg Cramps, Palpitations, Rapid Heart Rate, Shortness of Breath and Swelling of Extremities. Gastrointestinal Not Present- Abdominal Pain, Bloating, Bloody Stool, Change in Bowel Habits, Chronic diarrhea, Constipation, Difficulty Swallowing, Excessive gas, Gets full quickly at meals, Hemorrhoids, Indigestion, Nausea, Rectal Pain and Vomiting. Male Genitourinary Not Present- Blood in Urine, Change in Urinary Stream, Frequency, Impotence, Nocturia, Painful Urination, Urgency and Urine Leakage. Musculoskeletal Not Present- Back Pain, Joint Pain, Joint Stiffness, Muscle Pain, Muscle Weakness and Swelling of Extremities. Neurological Not Present- Decreased Memory, Fainting, Headaches, Numbness, Seizures, Tingling, Tremor, Trouble walking and Weakness. Psychiatric Not Present- Anxiety, Bipolar, Change in Sleep Pattern, Depression, Fearful and Frequent crying. Endocrine Not Present- Cold Intolerance, Excessive Hunger, Hair Changes, Heat Intolerance, Hot flashes and New Diabetes. Hematology Not Present- Blood Thinners, Easy Bruising, Excessive bleeding, Gland problems, HIV and Persistent Infections.  Vitals [name] [name]; 03/12/2016 2:54 PM) 03/12/2016 2:53 PM Weight: 351 lb Height: 72in Body Surface Area: 2.71 m Body Mass Index: 47.6 kg/m  Temp.: 98.66F(Temporal)  Pulse: 91 (Regular)  BP: 138/78 (Sitting, Left Arm, Standard)      Physical Exam (Izsak Meir A. Verneta Hamidi MD; 03/12/2016 3:14 PM)  Chest and Lung Exam Chest and lung exam reveals -quiet, even and easy respiratory effort with no use of accessory muscles and on auscultation, normal breath sounds, no adventitious sounds and normal vocal  resonance. Inspection Chest Wall - Normal. Back - normal.  Cardiovascular Cardiovascular examination reveals -normal heart sounds, regular rate and rhythm with no murmurs and normal pedal pulses bilaterally.  Abdomen Inspection Skin - Scar - no  surgical scars. Hernias - Ventral - Reducible. Umbilical hernia - Reducible. Palpation/Percussion Palpation and Percussion of the abdomen reveal - Soft, Non Tender, No Rebound tenderness, No Rigidity (guarding) and No hepatosplenomegaly. Auscultation Auscultation of the abdomen reveals - Bowel sounds normal.  Neurologic Neurologic evaluation reveals -alert and oriented x 3 with no impairment of recent or remote memory. Mental Status-Normal.  Musculoskeletal Normal Exam - Left-Upper Extremity Strength Normal and Lower Extremity Strength Normal. Normal Exam - Right-Upper Extremity Strength Normal, Lower Extremity Weakness.    Assessment & Plan (Amogh Komatsu A. Danniel Grenz MD; 03/12/2016 3:13 PM)  UMBILICAL HERNIA WITHOUT OBSTRUCTION AND WITHOUT GANGRENE (K42.9) Impression: Discussed repair versus observation. Discussed the need for mesh use in the potential complications of that with him today.  The risk of hernia repair include bleeding, infection, organ injury, bowel injury, bladder injury, nerve injury recurrent hernia, blood clots, worsening of underlying condition, chronic pain, mesh use, open surgery, death, and the need for other operattions. Pt agrees to proceed  Current Plans You are being scheduled for surgery - Our schedulers will call you.  You should hear from our office's scheduling department within 5 working days about the location, date, and time of surgery. We try to make accommodations for patient's preferences in scheduling surgery, but sometimes the OR schedule or the surgeon's schedule prevents us from making those accommodations.  If you have not heard from our office [phone]) in 5 working days, call the office  and ask for your surgeon's nurse.  If you have other questions about your diagnosis, plan, or surgery, call the office and ask for your surgeon's nurse.  Pt Education - Pamphlet Given - Hernia Surgery: discussed with patient and provided information. The anatomy & physiology of the abdominal wall was discussed. The pathophysiology of hernias was discussed. Natural history risks without surgery including progeressive enlargement, pain, incarceration, & strangulation was discussed. Contributors to complications such as smoking, obesity, diabetes, prior surgery, etc were discussed.  I feel the risks of no intervention will lead to serious problems that outweigh the operative risks; therefore, I recommended surgery to reduce and repair the hernia. I explained laparoscopic techniques with possible need for an open approach. I noted the probable use of mesh to patch and/or buttress the hernia repair  Risks such as bleeding, infection, abscess, need for further treatment, heart attack, death, and other risks were discussed. I noted a good likelihood this will help address the problem. Goals of post-operative recovery were discussed as well. Possibility that this will not correct all symptoms was explained. I stressed the importance of low-impact activity, aggressive pain control, avoiding constipation, & not pushing through pain to minimize risk of post-operative chronic pain or injury. Possibility of reherniation especially with smoking, obesity, diabetes, immunosuppression, and other health conditions was discussed. We will work to minimize complications.  An educational handout further explaining the pathology & treatment options was given as well. Questions were answered. The patient expresses understanding & wishes to proceed with surgery.

## 2016-04-10 NOTE — Anesthesia Postprocedure Evaluation (Signed)
Anesthesia Post Note  Patient: Lawrence RussellSteven Dalton  Procedure(s) Performed: Procedure(s) (LRB): HERNIA REPAIR UMBILICAL ADULT (N/A) INSERTION OF MESH (N/A)  Patient location during evaluation: PACU Anesthesia Type: General Level of consciousness: sedated Pain management: pain level controlled Vital Signs Assessment: post-procedure vital signs reviewed and stable Respiratory status: spontaneous breathing and respiratory function stable Cardiovascular status: stable Anesthetic complications: no    Last Vitals:  Vitals:   04/10/16 1115 04/10/16 1130  BP: (!) 142/82 (!) 143/90  Pulse: 87 88  Resp: 14 14  Temp:      Last Pain:  Vitals:   04/10/16 1130  TempSrc:   PainSc: 2                  Lashonne Shull DANIEL

## 2016-04-10 NOTE — Telephone Encounter (Signed)
Patient's wife called stating patient has vomited twice after surgery today. I recommended that she keep the patient nothing by mouth tonight. If he is still having vomiting in the morning, she will call the office.

## 2016-04-10 NOTE — Interval H&P Note (Signed)
History and Physical Interval Note:  04/10/2016 9:16 AM  Lawrence RussellSteven Dalton  has presented today for surgery, with the diagnosis of UMBILICAL HERNIA  The various methods of treatment have been discussed with the patient and family. After consideration of risks, benefits and other options for treatment, the patient has consented to  Procedure(s): HERNIA REPAIR UMBILICAL ADULT (N/A) INSERTION OF MESH (N/A) as a surgical intervention .  The patient's history has been reviewed, patient examined, no change in status, stable for surgery.  I have reviewed the patient's chart and labs.  Questions were answered to the patient's satisfaction.     Jonia Oakey A.

## 2016-04-10 NOTE — Anesthesia Procedure Notes (Signed)
Procedure Name: Intubation Date/Time: 04/10/2016 9:34 AM Performed by: Burna CashONRAD, Nickalous Stingley C Pre-anesthesia Checklist: Patient identified, Emergency Drugs available, Suction available and Patient being monitored Patient Re-evaluated:Patient Re-evaluated prior to inductionOxygen Delivery Method: Circle system utilized Preoxygenation: Pre-oxygenation with 100% oxygen Intubation Type: IV induction Ventilation: Two handed mask ventilation required Tube type: Oral Tube size: 8.0 mm Number of attempts: 1 Airway Equipment and Method: Stylet and Oral airway Placement Confirmation: ETT inserted through vocal cords under direct vision,  positive ETCO2 and breath sounds checked- equal and bilateral Secured at: 22 cm Tube secured with: Tape Dental Injury: Teeth and Oropharynx as per pre-operative assessment

## 2016-04-10 NOTE — Op Note (Signed)
Lawrence Dalton [birth year] [id number] 04/10/2016  Preoperative diagnosis: umbilical hernia   Postoperative diagnosis: incarcerated umbilical hernia   Procedure: Umbilical Hernia Repair with 4  cm  Ventralight mesh  Surgeon: Dortha Schwalbehomas A Xzavian Semmel, MD, FACS  Anesthesia: General and local    Clinical History and Indications: Pt presents for repair of painful umbilical hernia.  The risk of hernia repair include bleeding,  Infection,   Recurrence of the hernia,  Mesh use, chronic pain,  Organ injury,  Bowel injury,  Bladder injury,   nerve injury with numbness around the incision,  Death,  and worsening of preexisting  medical problems.  The alternatives to surgery have been discussed as well..  Long term expectations of both operative and non operative treatments have been discussed.   The patient agrees to proceed.  Procedure: The patient was seen in the preoperative area and the plans for the procedure reviewed again. He  had no further questions. I marked the area of the umbilicus as the operative site. He wishes to prodeed.  The patient was taken to the operating room and after satisfactory general  anesthesia had been obtained the area was clipped as needed, prepped and draped. The timeout was performed.  I used some local anesthesia to help with postoperative pain management. This was infiltrated around the umbilical area and additional infiltrated as I worked.  A curvilinear incision was made on the inferior aspect of the umbilicus. The umbilical skin was elevated off of the hernia sac. The hernia sac was dissected free of the subcutaneous tissues.  A large piece of preperitoneal fat was reduced back into the preperitoneal space.  A 4.3 cm  Mesh was  Placed into the preperitoneal space and secured to the fascia with 1 O novofil suture.  The fascia was closed over the defect with 1 O novofil.    Once the repair was complete the incision was closed by using some 3-0 Vicryl subcutaneous and 4-0  Monocryl subcuticular sutures.  The patient tolerated the procedure well. There were no operative complications. There was minimal blood loss. All counts were correct. He was taken to the PACU in satisfactory condition.  Harriette Bouillonhomas Larae Caison, MD, FACS 04/10/2016 10:33 AM

## 2016-04-11 ENCOUNTER — Encounter (HOSPITAL_BASED_OUTPATIENT_CLINIC_OR_DEPARTMENT_OTHER): Payer: Self-pay | Admitting: Surgery

## 2018-05-14 DIAGNOSIS — E291 Testicular hypofunction: Secondary | ICD-10-CM | POA: Diagnosis not present

## 2018-05-14 DIAGNOSIS — Z0000 Encounter for general adult medical examination without abnormal findings: Secondary | ICD-10-CM | POA: Diagnosis not present

## 2018-05-14 DIAGNOSIS — Z13220 Encounter for screening for lipoid disorders: Secondary | ICD-10-CM | POA: Diagnosis not present

## 2018-05-31 DIAGNOSIS — E291 Testicular hypofunction: Secondary | ICD-10-CM | POA: Diagnosis not present

## 2018-06-21 DIAGNOSIS — D72819 Decreased white blood cell count, unspecified: Secondary | ICD-10-CM | POA: Diagnosis not present

## 2018-10-27 DIAGNOSIS — H2513 Age-related nuclear cataract, bilateral: Secondary | ICD-10-CM | POA: Diagnosis not present

## 2018-10-27 DIAGNOSIS — H25013 Cortical age-related cataract, bilateral: Secondary | ICD-10-CM | POA: Diagnosis not present

## 2018-10-27 DIAGNOSIS — H40013 Open angle with borderline findings, low risk, bilateral: Secondary | ICD-10-CM | POA: Diagnosis not present

## 2018-10-27 DIAGNOSIS — H5213 Myopia, bilateral: Secondary | ICD-10-CM | POA: Diagnosis not present

## 2018-12-06 DIAGNOSIS — G4733 Obstructive sleep apnea (adult) (pediatric): Secondary | ICD-10-CM | POA: Diagnosis not present

## 2018-12-08 DIAGNOSIS — E291 Testicular hypofunction: Secondary | ICD-10-CM | POA: Diagnosis not present

## 2018-12-14 DIAGNOSIS — E291 Testicular hypofunction: Secondary | ICD-10-CM | POA: Diagnosis not present

## 2019-01-08 DIAGNOSIS — Z20828 Contact with and (suspected) exposure to other viral communicable diseases: Secondary | ICD-10-CM | POA: Diagnosis not present

## 2019-01-25 DIAGNOSIS — R030 Elevated blood-pressure reading, without diagnosis of hypertension: Secondary | ICD-10-CM | POA: Diagnosis not present

## 2019-01-25 DIAGNOSIS — R635 Abnormal weight gain: Secondary | ICD-10-CM | POA: Diagnosis not present

## 2019-06-03 DIAGNOSIS — Z0000 Encounter for general adult medical examination without abnormal findings: Secondary | ICD-10-CM | POA: Diagnosis not present

## 2019-06-09 DIAGNOSIS — E291 Testicular hypofunction: Secondary | ICD-10-CM | POA: Diagnosis not present

## 2019-06-09 DIAGNOSIS — Z125 Encounter for screening for malignant neoplasm of prostate: Secondary | ICD-10-CM | POA: Diagnosis not present

## 2019-06-14 DIAGNOSIS — E291 Testicular hypofunction: Secondary | ICD-10-CM | POA: Diagnosis not present

## 2019-06-30 DIAGNOSIS — Z131 Encounter for screening for diabetes mellitus: Secondary | ICD-10-CM | POA: Diagnosis not present

## 2019-06-30 DIAGNOSIS — E7800 Pure hypercholesterolemia, unspecified: Secondary | ICD-10-CM | POA: Diagnosis not present

## 2019-06-30 DIAGNOSIS — Z0000 Encounter for general adult medical examination without abnormal findings: Secondary | ICD-10-CM | POA: Diagnosis not present

## 2019-08-08 DIAGNOSIS — H40013 Open angle with borderline findings, low risk, bilateral: Secondary | ICD-10-CM | POA: Diagnosis not present

## 2019-11-07 DIAGNOSIS — H5213 Myopia, bilateral: Secondary | ICD-10-CM | POA: Diagnosis not present

## 2019-11-07 DIAGNOSIS — H40013 Open angle with borderline findings, low risk, bilateral: Secondary | ICD-10-CM | POA: Diagnosis not present

## 2019-12-06 DIAGNOSIS — Z125 Encounter for screening for malignant neoplasm of prostate: Secondary | ICD-10-CM | POA: Diagnosis not present

## 2019-12-06 DIAGNOSIS — E291 Testicular hypofunction: Secondary | ICD-10-CM | POA: Diagnosis not present

## 2019-12-13 DIAGNOSIS — E291 Testicular hypofunction: Secondary | ICD-10-CM | POA: Diagnosis not present

## 2019-12-24 DIAGNOSIS — Z20822 Contact with and (suspected) exposure to covid-19: Secondary | ICD-10-CM | POA: Diagnosis not present

## 2019-12-26 DIAGNOSIS — Z20822 Contact with and (suspected) exposure to covid-19: Secondary | ICD-10-CM | POA: Diagnosis not present

## 2019-12-26 DIAGNOSIS — Z9989 Dependence on other enabling machines and devices: Secondary | ICD-10-CM | POA: Diagnosis not present

## 2019-12-26 DIAGNOSIS — G4733 Obstructive sleep apnea (adult) (pediatric): Secondary | ICD-10-CM | POA: Diagnosis not present

## 2019-12-29 DIAGNOSIS — Z9989 Dependence on other enabling machines and devices: Secondary | ICD-10-CM | POA: Diagnosis not present

## 2019-12-29 DIAGNOSIS — G4733 Obstructive sleep apnea (adult) (pediatric): Secondary | ICD-10-CM | POA: Diagnosis not present

## 2020-01-08 DIAGNOSIS — Z20822 Contact with and (suspected) exposure to covid-19: Secondary | ICD-10-CM | POA: Diagnosis not present

## 2020-01-10 DIAGNOSIS — G4733 Obstructive sleep apnea (adult) (pediatric): Secondary | ICD-10-CM | POA: Diagnosis not present

## 2020-02-29 DIAGNOSIS — Z9989 Dependence on other enabling machines and devices: Secondary | ICD-10-CM | POA: Diagnosis not present

## 2020-02-29 DIAGNOSIS — G4733 Obstructive sleep apnea (adult) (pediatric): Secondary | ICD-10-CM | POA: Diagnosis not present

## 2020-03-01 ENCOUNTER — Other Ambulatory Visit (HOSPITAL_COMMUNITY): Payer: Self-pay | Admitting: General Surgery

## 2020-03-01 ENCOUNTER — Other Ambulatory Visit: Payer: Self-pay | Admitting: General Surgery

## 2020-03-10 DIAGNOSIS — F5089 Other specified eating disorder: Secondary | ICD-10-CM | POA: Diagnosis not present

## 2020-03-13 ENCOUNTER — Other Ambulatory Visit: Payer: Self-pay

## 2020-03-13 ENCOUNTER — Other Ambulatory Visit (HOSPITAL_COMMUNITY): Payer: Self-pay | Admitting: General Surgery

## 2020-03-13 ENCOUNTER — Ambulatory Visit (HOSPITAL_COMMUNITY)
Admission: RE | Admit: 2020-03-13 | Discharge: 2020-03-13 | Disposition: A | Discharge status: Home / Self Care | Payer: BC Managed Care – PPO | Source: Ambulatory Visit | Attending: General Surgery | Admitting: General Surgery

## 2020-03-13 DIAGNOSIS — Z01818 Encounter for other preprocedural examination: Secondary | ICD-10-CM | POA: Diagnosis not present

## 2020-03-14 DIAGNOSIS — E291 Testicular hypofunction: Secondary | ICD-10-CM | POA: Diagnosis not present

## 2020-03-30 DIAGNOSIS — G4733 Obstructive sleep apnea (adult) (pediatric): Secondary | ICD-10-CM | POA: Diagnosis not present

## 2020-03-30 DIAGNOSIS — Z9989 Dependence on other enabling machines and devices: Secondary | ICD-10-CM | POA: Diagnosis not present

## 2020-04-02 ENCOUNTER — Encounter: Payer: BC Managed Care – PPO | Attending: General Surgery | Admitting: Skilled Nursing Facility1

## 2020-04-02 ENCOUNTER — Encounter: Payer: Self-pay | Admitting: Skilled Nursing Facility1

## 2020-04-02 ENCOUNTER — Other Ambulatory Visit: Payer: Self-pay

## 2020-04-02 DIAGNOSIS — E669 Obesity, unspecified: Secondary | ICD-10-CM | POA: Diagnosis not present

## 2020-04-02 NOTE — Progress Notes (Signed)
Nutrition Assessment for Bariatric Surgery Medical Nutrition Therapy Appt Start Time: 2:07    End Time: 3:05  Patient was seen on 04/02/2020 for Pre-Operative Nutrition Assessment. Letter of approval faxed to [facility] bariatric surgery program coordinator on 04/02/2020  Referral stated Supervised Weight Loss (SWL) visits needed: 0  Planned surgery: sleeve gastrectomy  Pt expectation of surgery: to lose weight Pt expectation of dietitian: to guide    NUTRITION ASSESSMENT   Anthropometrics  Start weight at [facility]: 367.7 lbs (date: 04/02/2020)  Height: 72 in BMI: 49.87 kg/m2     Clinical  Medical hx: sleep apnea Medications:   Labs:  Notable signs/symptoms: back pain Any previous deficiencies? No  Micronutrient Nutrition Focused Physical Exam: Hair: No issues observed Eyes: No issues observed Mouth: No issues observed Neck: No issues observed Nails: No issues observed Skin: No issues observed  Lifestyle & Dietary Hx  Pt states he does not drink soda or eat beef. Pt states he understands he needs to eat until satisfaction not fullness and understands he needs to choose healthier foods stating he has been trying more vegan foods which he has really enjoyed.    24-Hr Dietary Recall First Meal: grapes and nuts Snack: grapes and nuts Second Meal: grapes and nuts or fast food Snack:  Third Meal: mac n cheese + pork Snack: yogurt Beverages: water   Estimated Energy Needs Calories: 2000   NUTRITION DIAGNOSIS  Overweight/obesity ([place]-3.3) related to past poor dietary habits and physical inactivity as evidenced by patient w/ planned sleeve gastrectomy surgery following dietary guidelines for continued weight loss.    NUTRITION INTERVENTION  Nutrition counseling (C-1) and education (E-2) to facilitate bariatric surgery goals.   Pre-Op Goals Reviewed with the Patient  Track food and beverage intake (pen and paper, MyFitness Pal, Baritastic app,  etc.)  Make healthy food choices while monitoring portion sizes  Consume 3 meals per day or try to eat every 3-5 hours  Avoid concentrated sugars and fried foods  Keep sugar & fat in the single digits per serving on food labels  Practice CHEWING your food (aim for applesauce consistency)  Practice not drinking 15 minutes before, during, and 30 minutes after each meal and snack  Avoid all carbonated beverages (ex: soda, sparkling beverages)   Limit caffeinated beverages (ex: coffee, tea, energy drinks)  Avoid all sugar-sweetened beverages (ex: regular soda, sports drinks)   Avoid alcohol   Aim for 64-100 ounces of FLUID daily (with at least half of fluid intake being plain water)   Aim for at least 60-80 grams of PROTEIN daily  Look for a liquid protein source that contains ?15 g protein and ?5 g carbohydrate (ex: shakes, drinks, shots)  Make a list of non-food related activities  Physical activity is an important part of a healthy lifestyle so keep it moving! The goal is to reach 150 minutes of exercise per week, including cardiovascular and weight baring activity.  *Goals that are bolded indicate the pt would like to start working towards these  Handouts Provided Include   Bariatric Surgery handouts (Nutrition Visits, Pre-Op Goals, Protein Shakes, Vitamins & Minerals)  Learning Style & Readiness for Change Teaching method utilized: Visual & Auditory  Demonstrated degree of understanding via: Teach Back  Barriers to learning/adherence to lifestyle change: none identified    MONITORING & EVALUATION Dietary intake, weekly physical activity, body weight, and pre-op goals reached at next nutrition visit.    Next Steps  Patient is to follow up at [place]  for Pre-Op Class >2 weeks before surgery for further nutrition education.

## 2020-04-13 DIAGNOSIS — H00011 Hordeolum externum right upper eyelid: Secondary | ICD-10-CM | POA: Diagnosis not present

## 2020-04-25 ENCOUNTER — Other Ambulatory Visit: Payer: Self-pay | Admitting: Gastroenterology

## 2020-04-25 DIAGNOSIS — Z1211 Encounter for screening for malignant neoplasm of colon: Secondary | ICD-10-CM | POA: Diagnosis not present

## 2020-05-14 ENCOUNTER — Ambulatory Visit: Payer: BC Managed Care – PPO

## 2020-05-22 DIAGNOSIS — E291 Testicular hypofunction: Secondary | ICD-10-CM | POA: Diagnosis not present

## 2020-05-22 DIAGNOSIS — Z125 Encounter for screening for malignant neoplasm of prostate: Secondary | ICD-10-CM | POA: Diagnosis not present

## 2020-05-29 DIAGNOSIS — Z0000 Encounter for general adult medical examination without abnormal findings: Secondary | ICD-10-CM | POA: Diagnosis not present

## 2020-05-29 DIAGNOSIS — R7309 Other abnormal glucose: Secondary | ICD-10-CM | POA: Diagnosis not present

## 2020-05-29 DIAGNOSIS — E7800 Pure hypercholesterolemia, unspecified: Secondary | ICD-10-CM | POA: Diagnosis not present

## 2020-06-01 ENCOUNTER — Other Ambulatory Visit (HOSPITAL_COMMUNITY): Payer: BC Managed Care – PPO

## 2020-06-01 DIAGNOSIS — H40013 Open angle with borderline findings, low risk, bilateral: Secondary | ICD-10-CM | POA: Diagnosis not present

## 2020-06-05 ENCOUNTER — Inpatient Hospital Stay: Admit: 2020-06-05 | Payer: BC Managed Care – PPO | Admitting: General Surgery

## 2020-06-05 SURGERY — GASTRECTOMY, SLEEVE, LAPAROSCOPIC
Anesthesia: General

## 2020-06-27 NOTE — Progress Notes (Signed)
Pharmacy: [facility] [place]. Smokes occasional cigars Has pre-procedure instructions given to him by office Has covid test scheduled.

## 2020-07-06 ENCOUNTER — Other Ambulatory Visit: Payer: Self-pay | Admitting: Gastroenterology

## 2020-07-06 ENCOUNTER — Other Ambulatory Visit (HOSPITAL_COMMUNITY)
Admission: RE | Admit: 2020-07-06 | Discharge: 2020-07-06 | Disposition: A | Discharge status: Home / Self Care | Payer: BC Managed Care – PPO | Source: Ambulatory Visit | Attending: Gastroenterology | Admitting: Gastroenterology

## 2020-07-06 DIAGNOSIS — Z01812 Encounter for preprocedural laboratory examination: Secondary | ICD-10-CM | POA: Insufficient documentation

## 2020-07-06 DIAGNOSIS — U071 COVID-19: Secondary | ICD-10-CM | POA: Insufficient documentation

## 2020-07-06 LAB — SARS CORONAVIRUS 2 (TAT 6-24 HRS): SARS Coronavirus 2: POSITIVE — AB

## 2020-07-07 NOTE — Progress Notes (Signed)
Called Marie from the on-call service of Dr. Roderic Scarce with + covid results for this pt. The pt is to have surgery on Tues 07/10/20 at [facility] at 8:30am.   These are the current guidelines:  Positive Results for:  Asymptomatic: Procedure postponed and quarantined for 10 days. Symptomatic: Procedure postponed and quarantined for 14 days. Hospitalized with Covid: postponed and quarantined  for 21 days. Immunocompromised: Procedure postponed and quarantine for 20 days.  The pt will not be retested for 90 days from the + result.

## 2020-07-09 ENCOUNTER — Encounter: Payer: BC Managed Care – PPO | Attending: General Surgery | Admitting: Skilled Nursing Facility1

## 2020-07-09 ENCOUNTER — Other Ambulatory Visit: Payer: Self-pay

## 2020-07-09 DIAGNOSIS — E669 Obesity, unspecified: Secondary | ICD-10-CM

## 2020-07-09 DIAGNOSIS — Z6841 Body Mass Index (BMI) 40.0 and over, adult: Secondary | ICD-10-CM | POA: Insufficient documentation

## 2020-07-09 NOTE — Progress Notes (Signed)
Pre-Operative Nutrition Class:  Appt start time: 3606   End time:  1830.  Patient was seen on 07/09/2020 for Pre-Operative Bariatric Surgery Education at [facility].    Class done virtually pt verbally recognized the limitations of this visit type; pt identified via DOB and name  Surgery date:  Surgery type: sleeve Start weight at [facility]: 367.7 Weight today: webex meeting  The following the learning objectives were met by the patient during this course:  Identify Pre-Op Dietary Goals and will begin 2 weeks pre-operatively  Identify appropriate sources of fluids and proteins   State protein recommendations and appropriate sources pre and post-operatively  Identify Post-Operative Dietary Goals and will follow for 2 weeks post-operatively  Identify appropriate multivitamin and calcium sources  Describe the need for physical activity post-operatively and will follow MD recommendations  State when to call healthcare provider regarding medication questions or post-operative complications  Handouts given during class include:  Pre-Op Bariatric Surgery Diet Handout  Protein Shake Handout  Post-Op Bariatric Surgery Nutrition Handout  BELT Program Information Flyer  Support Group Information Flyer  [facility] Bariatric Supplements Price List  Follow-Up Plan: Patient will follow-up at [facility] 2 weeks post operatively for diet advancement per MD.

## 2020-07-10 ENCOUNTER — Ambulatory Visit (HOSPITAL_COMMUNITY)
Admission: RE | Admit: 2020-07-10 | Payer: BC Managed Care – PPO | Source: Home / Self Care | Admitting: Gastroenterology

## 2020-07-10 SURGERY — COLONOSCOPY WITH PROPOFOL
Anesthesia: Monitor Anesthesia Care

## 2020-07-17 ENCOUNTER — Ambulatory Visit: Payer: Self-pay | Admitting: General Surgery

## 2020-07-17 NOTE — Progress Notes (Signed)
Please place orders in epic pt. Scheduled for preop 

## 2020-07-17 NOTE — Patient Instructions (Addendum)
DUE TO COVID-19 ONLY ONE VISITOR IS ALLOWED TO COME WITH YOU AND STAY IN THE WAITING ROOM ONLY DURING PRE OP AND PROCEDURE DAY OF SURGERY. THE 1 VISITOR  MAY VISIT WITH YOU AFTER SURGERY IN YOUR PRIVATE ROOM DURING VISITING HOURS ONLY!               Lawrence Dalton    Your procedure is scheduled on: 07-30-20   Report to [facility] Main  Entrance   Report to Short stay  at : 05:30 AM     Call this number if you have problems the morning of surgery [phone]    Remember: MORNING OF SURGERY DRINK:   DRINK 1 G2 drink BEFORE YOU LEAVE HOME, DRINK ALL OF THE  G2 DRINK AT ONE TIME.   NO SOLID FOOD AFTER 600 PM THE NIGHT BEFORE YOUR SURGERY. YOU MAY DRINK CLEAR FLUIDS. THE G2 DRINK YOU DRINK BEFORE YOU LEAVE HOME WILL BE THE LAST FLUIDS YOU DRINK BEFORE SURGERY.  PAIN IS EXPECTED AFTER SURGERY AND WILL NOT BE COMPLETELY ELIMINATED. AMBULATION AND TYLENOL WILL HELP REDUCE INCISIONAL AND GAS PAIN. MOVEMENT IS KEY!  YOU ARE EXPECTED TO BE OUT OF BED WITHIN 4 HOURS OF ADMISSION TO YOUR PATIENT ROOM.  SITTING IN THE RECLINER THROUGHOUT THE DAY IS IMPORTANT FOR DRINKING FLUIDS AND MOVING GAS THROUGHOUT THE GI TRACT.  COMPRESSION STOCKINGS SHOULD BE WORN [facility] STAY UNLESS YOU ARE WALKING.   INCENTIVE SPIROMETER SHOULD BE USED EVERY HOUR WHILE AWAKE TO DECREASE POST-OPERATIVE COMPLICATIONS SUCH AS PNEUMONIA.  WHEN DISCHARGED HOME, IT IS IMPORTANT TO CONTINUE TO WALK EVERY HOUR AND USE THE INCENTIVE SPIROMETER EVERY HOUR.   CLEAR LIQUID DIET  Foods Allowed                                                                            Foods Excluded  Black Coffee and tea, regular and decaf                                                  liquids that you cannot  Plain Jell-O any favor except red or purple                                           see through such as: Fruit ices (not with fruit pulp)                                                  milk, soups, orange juice   Iced Popsicles  All solid food                               Cranberry, grape and apple juices Sports drinks like Gatorade Lightly seasoned clear broth or consume(fat free) Sugar, honey syrup  Sample Menu Breakfast                                Lunch                                     Supper Cranberry juice                    Beef broth                            Chicken broth Jell-O                                     Grape juice                           Apple juice Coffee or tea                        Jell-O                                      Popsicle                                                Coffee or tea                        Coffee or tea  _____________________________________________________________________   BRUSH YOUR TEETH MORNING OF SURGERY AND RINSE YOUR MOUTH OUT, NO CHEWING GUM CANDY OR MINTS.    Take these medicines the morning of surgery with A SIP OF WATER: cetirizine as needed.                   You may not have any metal on your body including hair pins and              piercings  Do not wear jewelry,lotions, powders or perfumes, deodorant .           Men may shave face and neck.   Do not bring valuables to the hospital. [facility] IS NOT             RESPONSIBLE   FOR VALUABLES.  Contacts, dentures or bridgework may not be worn into surgery.   Patients discharged the day of surgery will not be allowed to drive home. IF YOU ARE HAVING SURGERY AND GOING HOME THE SAME DAY, YOU MUST HAVE AN ADULT TO DRIVE YOU HOME AND BE WITH YOU FOR 24 HOURS. YOU MAY GO HOME BY TAXI OR UBER OR ORTHERWISE, BUT AN ADULT MUST ACCOMPANY YOU HOME AND STAY WITH YOU FOR  24 HOURS.  Name and phone number of your driver:  Special Instructions: N/A              Please read over the following fact sheets you were given: _____________________________________________________________________        [facility] - Preparing for  Surgery Before surgery, you can play an important role.  Because skin is not sterile, your skin needs to be as free of germs as possible.  You can reduce the number of germs on your skin by washing with CHG (chlorahexidine gluconate) soap before surgery.  CHG is an antiseptic cleaner which kills germs and bonds with the skin to continue killing germs even after washing. Please DO NOT use if you have an allergy to CHG or antibacterial soaps.  If your skin becomes reddened/irritated stop using the CHG and inform your nurse when you arrive at Short Stay. Do not shave (including legs and underarms) for at least 48 hours prior to the first CHG shower.  You may shave your face/neck. Please follow these instructions carefully:  1.  Shower with CHG Soap the night before surgery and the  morning of Surgery.  2.  If you choose to wash your hair, wash your hair first as usual with your  normal  shampoo.  3.  After you shampoo, rinse your hair and body thoroughly to remove the  shampoo.                           4.  Use CHG as you would any other liquid soap.  You can apply chg directly  to the skin and wash                       Gently with a scrungie or clean washcloth.  5.  Apply the CHG Soap to your body ONLY FROM THE NECK DOWN.   Do not use on face/ open                           Wound or open sores. Avoid contact with eyes, ears mouth and genitals (private parts).                       Wash face,  Genitals (private parts) with your normal soap.             6.  Wash thoroughly, paying special attention to the area where your surgery  will be performed.  7.  Thoroughly rinse your body with warm water from the neck down.  8.  DO NOT shower/wash with your normal soap after using and rinsing off  the CHG Soap.                9.  Pat yourself dry with a clean towel.            10.  Wear clean pajamas.            11.  Place clean sheets on your bed the night of your first shower and do not  sleep with pets. Day  of Surgery : Do not apply any lotions/deodorants the morning of surgery.  Please wear clean clothes to the hospital/surgery center.  FAILURE TO FOLLOW THESE INSTRUCTIONS MAY RESULT IN THE CANCELLATION OF YOUR SURGERY PATIENT SIGNATURE_________________________________  NURSE SIGNATURE__________________________________  ________________________________________________________________________   Rogelia Mire  An incentive spirometer is a tool that  can help keep your lungs clear and active. This tool measures how well you are filling your lungs with each breath. Taking long deep breaths may help reverse or decrease the chance of developing breathing (pulmonary) problems (especially infection) following:  A long period of time when you are unable to move or be active. BEFORE THE PROCEDURE   If the spirometer includes an indicator to show your best effort, your nurse or respiratory therapist will set it to a desired goal.  If possible, sit up straight or lean slightly forward. Try not to slouch.  Hold the incentive spirometer in an upright position. INSTRUCTIONS FOR USE  1. Sit on the edge of your bed if possible, or sit up as far as you can in bed or on a chair. 2. Hold the incentive spirometer in an upright position. 3. Breathe out normally. 4. Place the mouthpiece in your mouth and seal your lips tightly around it. 5. Breathe in slowly and as deeply as possible, raising the piston or the ball toward the top of the column. 6. Hold your breath for 3-5 seconds or for as long as possible. Allow the piston or ball to fall to the bottom of the column. 7. Remove the mouthpiece from your mouth and breathe out normally. 8. Rest for a few seconds and repeat Steps 1 through 7 at least 10 times every 1-2 hours when you are awake. Take your time and take a few normal breaths between deep breaths. 9. The spirometer may include an indicator to show your best effort. Use the indicator as a goal  to work toward during each repetition. 10. After each set of 10 deep breaths, practice coughing to be sure your lungs are clear. If you have an incision (the cut made at the time of surgery), support your incision when coughing by placing a pillow or rolled up towels firmly against it. Once you are able to get out of bed, walk around indoors and cough well. You may stop using the incentive spirometer when instructed by your caregiver.  RISKS AND COMPLICATIONS  Take your time so you do not get dizzy or light-headed.  If you are in pain, you may need to take or ask for pain medication before doing incentive spirometry. It is harder to take a deep breath if you are having pain. AFTER USE  Rest and breathe slowly and easily.  It can be helpful to keep track of a log of your progress. Your caregiver can provide you with a simple table to help with this. If you are using the spirometer at home, follow these instructions: SEEK MEDICAL CARE IF:   You are having difficultly using the spirometer.  You have trouble using the spirometer as often as instructed.  Your pain medication is not giving enough relief while using the spirometer.  You develop fever of 100.5 F (38.1 C) or higher. SEEK IMMEDIATE MEDICAL CARE IF:   You cough up bloody sputum that had not been present before.  You develop fever of 102 F (38.9 C) or greater.  You develop worsening pain at or near the incision site. MAKE SURE YOU:   Understand these instructions.  Will watch your condition.  Will get help right away if you are not doing well or get worse. Document Released: 10/20/2006 Document Revised: 09/01/2011 Document Reviewed: 12/21/2006 [facility] Patient Information 2014 [place], [place].

## 2020-07-17 NOTE — Progress Notes (Signed)
PCP - Darrow Bussing, md Cardiologist -   PPM/ICD -  Device Orders -  Rep Notified -   Chest x-ray - 03-13-20 EKG - 03-13-20 epic Stress Test -  ECHO -  Cardiac Cath -   Sleep Study -  CPAP -   Fasting Blood Sugar -  Checks Blood Sugar _____ times a day  Blood Thinner Instructions: Aspirin Instructions:  ERAS Protcol - PRE-SURGERY G2-   COVID TEST- COVID + 07-06-20 IN EPIC   Anesthesia review:   Patient denies shortness of breath, fever, cough and chest pain at PAT appointment    All instructions explained to the patient, with a verbal understanding of the material. Patient agrees to go over the instructions while at home for a better understanding. Patient also instructed to self quarantine after being tested for COVID-19. The opportunity to ask questions was provided.

## 2020-07-18 ENCOUNTER — Ambulatory Visit: Payer: Self-pay | Admitting: General Surgery

## 2020-07-18 DIAGNOSIS — R7303 Prediabetes: Secondary | ICD-10-CM | POA: Diagnosis not present

## 2020-07-18 DIAGNOSIS — Z9989 Dependence on other enabling machines and devices: Secondary | ICD-10-CM | POA: Diagnosis not present

## 2020-07-18 DIAGNOSIS — G4733 Obstructive sleep apnea (adult) (pediatric): Secondary | ICD-10-CM | POA: Diagnosis not present

## 2020-07-18 NOTE — H&P (Signed)
Lawrence Dalton Appointment: 07/18/2020 10:45 AM Location: [facility] Patient #: [id number] DOB: 08/26/1970 Married / Language: English / Race: Black or African American Male  History of Present Illness Lawrence Hiss M. [name] Dalton; 07/18/2020 11:16 AM) The patient is a 50 year old male who presents for a bariatric surgery evaluation. He comes in for long-term follow-up regarding his severe obesity, obstructive sleep apnea, central low back pain and consideration for bariatric surgery. He has completed the pathway. He is received psychological nutritional clearance. He denies any medical changes since we initially met in September. He did test positive for coded on 07-21-22 as part of a preprocedure screening for a colonoscopy. He states that his only symptoms were a typical cold. He had no fever shortness of breath. He just had to have a runny nose and cough. He did not require hospitalization. He did not receive any steroids or antibody treatment. He states that he is back to his baseline. No shortness of breath. No trouble breathing. Still using CPAP. He is RV started on taking bariatric multivitamin. He also bought a stationary bike and rower for his house. He is also tentatively signed up to work out with Physiological scientist. 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 His upper GI and chest x-ray were unremarkable. His A1c was 6. His total cholesterol was 286, LDL was 138. Hematocrit 51. Creatinine was 1.32. He had additional creatinine in December which was better at 1.27. BUN 14. He also had additional lipid panel in December and his total cholesterol level was improved at 228. Triglycerides 168, LDL 148. HDL 51. A1c was 5.9   9/21 He is referred by Dr Dorthy Cooler for evaluation of bariatric surgery. He completed our seminar on line. He is interested in the sleeve gastrectomy because it has less complications and a gastric bypass. Despite numerous attempts he has been unsuccessful  for sustained weight loss. He is been to a bariatric clinic and done a prescription weight loss medication without long-term success. He is also done low-calorie diets as well as Akin diets without long-term success. He is trying to eat more plant based foods. He generally walks about 2 miles per day.  His comorbidities include obstructive sleep apnea on CPAP, central low back pain with occasional right buttock sciatica  He denies any chest pain, chest pressure, source of breath, orthopnea, dyspnea on exertion, paroxysmal nocturnal dyspnea, peripheral edema, personal family history of blood clots. He does use CPAP daily. He denies any heartburn, reflux or indigestion. He denies any trouble swallowing solids or liquids. He has a daily bowel movement. No melena or hematochezia. He has had an open umbilical hernia repair with mesh-4.3 cm piece of mesh. He denies any dysuria or hematuria. He does have some central chronic low back pain with occasional right buttock sciatica. He denies a diagnosis of diabetes or prediabetes. He denies any severe headaches or blurry vision. He denies any TIAs or amaurosis fugax.  He smokes cigars a couple times a week. He drinks alcohol maybe once or twice a month on the weekend. No drug use. 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 Lawrence Dalton; 07/18/2020 11:18 AM) PREDIABETES (R73.03) OSA ON CPAP (G47.33) SEVERE OBESITY (E66.01) I think the patient is a candidate for weight loss surgery. I think he is a candidate for sleeve gastrectomy. This patient encounter took 23 minutes today to perform the following: take history, perform exam, review outside records, interpret imaging, counsel the patient on their diagnosis  and document encounter, findings & plan in the EHR HYPERCHOLESTEREMIA (E78.00)  Past Surgical History Lawrence Hiss M. Lawrence Dalton; 07/18/2020 11:18 AM) No pertinent past surgical history  Diagnostic Studies History  Lawrence Hiss M. Lawrence Dalton; 07/18/2020 11:18 AM) Colonoscopy never  Allergies ([name], [name]; 07/18/2020 10:55 AM) No Known Drug Allergies [03/12/2016]: No Known Allergies [07/18/2020]: Allergies Reconciled  Medication History ([name], [name]; 07/18/2020 10:55 AM) Advil (200MG  Tablet, Oral) Active. Multivitamin Adult (Oral) Active. Testosterone Cypionate (100MG /ML Solution, Intramuscular) Active. (Inject once every other week.) Medications Reconciled  Social History Lawrence Hiss M. Lawrence Dalton; 07/18/2020 11:18 AM) Alcohol use Occasional alcohol use. No drug use Tobacco use Current some day smoker, Never smoker. Caffeine use Coffee.  Family History Lawrence Hiss M. Lawrence Dalton; 07/18/2020 11:18 AM) Alcohol Abuse Father. Cervical Cancer Mother. Kidney Disease Father. Breast Cancer Family Members In General. Diabetes Mellitus Family Members In General. Heart Disease Family Members In General.  Other Problems Lawrence Hiss M. Lawrence Dalton; 07/18/2020 11:18 AM) Back Pain Sleep Apnea Umbilical Hernia Repair    Vitals ([name] [name]; 07/18/2020 10:56 AM) 07/18/2020 10:55 AM Weight: 363.13 lb Height: 70in Neck: 18in Body Surface Area: 2.69 m Body Mass Index: 52.1 kg/m  Temp.: 98.58F  Pulse: 98 (Regular)  P.OX: 99% (Room air) BP: 124/80(Sitting, Left Arm, Standard)        Physical Exam Lawrence Hiss M. [name] Dalton; 07/18/2020 11:16 AM)  The physical exam findings are as follows: Note:severe obesity, evenly distributed  General Mental Status-Alert. General Appearance-Consistent with stated age. Hydration-Well hydrated. Voice-Normal.  Head and Neck Head-normocephalic, atraumatic with no lesions or palpable masses. Trachea-midline. Thyroid Gland Characteristics - normal size and consistency.  Eye Eyeball - Bilateral-Extraocular movements intact. Sclera/Conjunctiva - Bilateral-No scleral icterus.  Chest and Lung  Exam Chest and lung exam reveals -quiet, even and easy respiratory effort with no use of accessory muscles and on auscultation, normal breath sounds, no adventitious sounds and normal vocal resonance. Inspection Chest Wall - Normal. Back - normal.  Breast - Did not examine.  Cardiovascular Cardiovascular examination reveals -normal heart sounds, regular rate and rhythm with no murmurs and normal pedal pulses bilaterally.  Abdomen Inspection Inspection of the abdomen reveals - No Hernias. Skin - Scar - no surgical scars. Palpation/Percussion Palpation and Percussion of the abdomen reveal - Soft, Non Tender, No Rebound tenderness, No Rigidity (guarding) and No hepatosplenomegaly. Auscultation Auscultation of the abdomen reveals - Bowel sounds normal.  Peripheral Vascular Upper Extremity Palpation - Pulses bilaterally normal.  Neurologic Neurologic evaluation reveals -alert and oriented x 3 with no impairment of recent or remote memory. Mental Status-Normal.  Neuropsychiatric The patient's mood and affect are described as -normal. Judgment and Insight-insight is appropriate concerning matters relevant to self.  Musculoskeletal Normal Exam - Left-Upper Extremity Strength Normal and Lower Extremity Strength Normal. Normal Exam - Right-Upper Extremity Strength Normal and Lower Extremity Strength Normal. Note: b/l knee crepitus  Lymphatic Head & Neck  General Head & Neck Lymphatics: Bilateral - Description - Normal. Axillary - Did not examine. Femoral & Inguinal - Did not examine.    Assessment & Plan Lawrence Hiss M. Charron Coultas Dalton; 07/18/2020 11:18 AM)  SEVERE OBESITY (E66.01) Story: I think the patient is a candidate for weight loss surgery. I think he is a candidate for sleeve gastrectomy. This patient encounter took 23 minutes today to perform the following: take history, perform exam, review outside records, interpret imaging, counsel the patient on their diagnosis  and document encounter, findings & plan in the EHR Impression: The patient meets  weight loss surgery criteria. I think the patient would be an acceptable candidate for Laparoscopic vertical sleeve gastrectomy.  We reviewed his preoperative workup. We discussed his labs including the finding of prediabetes and some cholesterol and his creatinine. We discussed his upper GI and chest x-ray results. He has received psychological and nutritional clearance. He has attended his preoperative education class. We rediscussed the typical postoperative recovery. We discussed the typical issues that we see routinely after surgery. We discussed the importance of the preoperative meal plan.  He had asymptomatic Covid on January 14. I think we can proceed with surgery on February 2 that'll be about 3-1/2 weeks from his positive tests. He was asymptomatic. He did not require any treatment. He is back to his baseline.  All of his questions were asked and answered  Current Plans Pt Education - EMW_preopbariatric  OSA ON CPAP (G47.33)   PREDIABETES (R73.03)   HYPERCHOLESTEREMIA (E78.00)  Leighton Ruff. Lawrence Dalton, FACS General, Bariatric, & Minimally Invasive Surgery [facility] [place]

## 2020-07-18 NOTE — H&P (View-Only) (Signed)
Dorma Russell Appointment: 07/18/2020 10:45 AM Location: [facility] Patient #: [id number] DOB: 05/27/1971 Married / Language: English / Race: Black or African American Male  History of Present Illness Minerva Areola M. Gibril Mastro MD; 07/18/2020 11:16 AM) The patient is a 50 year old male who presents for a bariatric surgery evaluation. He comes in for long-term follow-up regarding his severe obesity, obstructive sleep apnea, central low back pain and consideration for bariatric surgery. He has completed the pathway. He is received psychological nutritional clearance. He denies any medical changes since we initially met in September. He did test positive for coded on 2024-01-29as part of a preprocedure screening for a colonoscopy. He states that his only symptoms were a typical cold. He had no fever shortness of breath. He just had to have a runny nose and cough. He did not require hospitalization. He did not receive any steroids or antibody treatment. He states that he is back to his baseline. No shortness of breath. No trouble breathing. Still using CPAP. He is RV started on taking bariatric multivitamin. He also bought a stationary bike and rower for his house. He is also tentatively signed up to work out with Systems analyst. 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 His upper GI and chest x-ray were unremarkable. His A1c was 6. His total cholesterol was 286, LDL was 138. Hematocrit 51. Creatinine was 1.32. He had additional creatinine in December which was better at 1.27. BUN 14. He also had additional lipid panel in December and his total cholesterol level was improved at 228. Triglycerides 168, LDL 148. HDL 51. A1c was 5.9   9/21 He is referred by Dr Docia Chuck for evaluation of bariatric surgery. He completed our seminar on line. He is interested in the sleeve gastrectomy because it has less complications and a gastric bypass. Despite numerous attempts he has been unsuccessful  for sustained weight loss. He is been to a bariatric clinic and done a prescription weight loss medication without long-term success. He is also done low-calorie diets as well as Akin diets without long-term success. He is trying to eat more plant based foods. He generally walks about 2 miles per day.  His comorbidities include obstructive sleep apnea on CPAP, central low back pain with occasional right buttock sciatica  He denies any chest pain, chest pressure, source of breath, orthopnea, dyspnea on exertion, paroxysmal nocturnal dyspnea, peripheral edema, personal family history of blood clots. He does use CPAP daily. He denies any heartburn, reflux or indigestion. He denies any trouble swallowing solids or liquids. He has a daily bowel movement. No melena or hematochezia. He has had an open umbilical hernia repair with mesh-4.3 cm piece of mesh. He denies any dysuria or hematuria. He does have some central chronic low back pain with occasional right buttock sciatica. He denies a diagnosis of diabetes or prediabetes. He denies any severe headaches or blurry vision. He denies any TIAs or amaurosis fugax.  He smokes cigars a couple times a week. He drinks alcohol maybe once or twice a month on the weekend. No drug use. 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 Mao Lockner, MD; 07/18/2020 11:18 AM) PREDIABETES (R73.03) OSA ON CPAP (G47.33) SEVERE OBESITY (E66.01) I think the patient is a candidate for weight loss surgery. I think he is a candidate for sleeve gastrectomy. This patient encounter took 23 minutes today to perform the following: take history, perform exam, review outside records, interpret imaging, counsel the patient on their diagnosis  and document encounter, findings & plan in the EHR HYPERCHOLESTEREMIA (E78.00)  Past Surgical History [name] [name], MD; 07/18/2020 11:18 AM) No pertinent past surgical history  Diagnostic Studies History  [name] [name], MD; 07/18/2020 11:18 AM) Colonoscopy never  Allergies ([name], [name]; 07/18/2020 10:55 AM) No Known Drug Allergies [03/12/2016]: No Known Allergies [07/18/2020]: Allergies Reconciled  Medication History ([name], [name]; 07/18/2020 10:55 AM) Advil (200MG  Tablet, Oral) Active. Multivitamin Adult (Oral) Active. Testosterone Cypionate (100MG /ML Solution, Intramuscular) Active. (Inject once every other week.) Medications Reconciled  Social History [name] [name], MD; 07/18/2020 11:18 AM) Alcohol use Occasional alcohol use. No drug use Tobacco use Current some day smoker, Never smoker. Caffeine use Coffee.  Family History [name] [name], MD; 07/18/2020 11:18 AM) Alcohol Abuse Father. Cervical Cancer Mother. Kidney Disease Father. Breast Cancer Family Members In General. Diabetes Mellitus Family Members In General. Heart Disease Family Members In General.  Other Problems [name] [name], MD; 07/18/2020 11:18 AM) Back Pain Sleep Apnea Umbilical Hernia Repair    Vitals ([name] [name]; 07/18/2020 10:56 AM) 07/18/2020 10:55 AM Weight: 363.13 lb Height: 70in Neck: 18in Body Surface Area: 2.69 m Body Mass Index: 52.1 kg/m  Temp.: 98.84F  Pulse: 98 (Regular)  P.OX: 99% (Room air) BP: 124/80(Sitting, Left Arm, Standard)        Physical Exam [name] Eugene Zeiders MD; 07/18/2020 11:16 AM)  The physical exam findings are as follows: Note:severe obesity, evenly distributed  General Mental Status-Alert. General Appearance-Consistent with stated age. Hydration-Well hydrated. Voice-Normal.  Head and Neck Head-normocephalic, atraumatic with no lesions or palpable masses. Trachea-midline. Thyroid Gland Characteristics - normal size and consistency.  Eye Eyeball - Bilateral-Extraocular movements intact. Sclera/Conjunctiva - Bilateral-No scleral icterus.  Chest and Lung  Exam Chest and lung exam reveals -quiet, even and easy respiratory effort with no use of accessory muscles and on auscultation, normal breath sounds, no adventitious sounds and normal vocal resonance. Inspection Chest Wall - Normal. Back - normal.  Breast - Did not examine.  Cardiovascular Cardiovascular examination reveals -normal heart sounds, regular rate and rhythm with no murmurs and normal pedal pulses bilaterally.  Abdomen Inspection Inspection of the abdomen reveals - No Hernias. Skin - Scar - no surgical scars. Palpation/Percussion Palpation and Percussion of the abdomen reveal - Soft, Non Tender, No Rebound tenderness, No Rigidity (guarding) and No hepatosplenomegaly. Auscultation Auscultation of the abdomen reveals - Bowel sounds normal.  Peripheral Vascular Upper Extremity Palpation - Pulses bilaterally normal.  Neurologic Neurologic evaluation reveals -alert and oriented x 3 with no impairment of recent or remote memory. Mental Status-Normal.  Neuropsychiatric The patient's mood and affect are described as -normal. Judgment and Insight-insight is appropriate concerning matters relevant to self.  Musculoskeletal Normal Exam - Left-Upper Extremity Strength Normal and Lower Extremity Strength Normal. Normal Exam - Right-Upper Extremity Strength Normal and Lower Extremity Strength Normal. Note: b/l knee crepitus  Lymphatic Head & Neck  General Head & Neck Lymphatics: Bilateral - Description - Normal. Axillary - Did not examine. Femoral & Inguinal - Did not examine.    Assessment & Plan [name] Tomoki Lucken MD; 07/18/2020 11:18 AM)  SEVERE OBESITY (E66.01) Story: I think the patient is a candidate for weight loss surgery. I think he is a candidate for sleeve gastrectomy. This patient encounter took 23 minutes today to perform the following: take history, perform exam, review outside records, interpret imaging, counsel the patient on their diagnosis  and document encounter, findings & plan in the EHR Impression: The patient meets  weight loss surgery criteria. I think the patient would be an acceptable candidate for Laparoscopic vertical sleeve gastrectomy.  We reviewed his preoperative workup. We discussed his labs including the finding of prediabetes and some cholesterol and his creatinine. We discussed his upper GI and chest x-ray results. He has received psychological and nutritional clearance. He has attended his preoperative education class. We rediscussed the typical postoperative recovery. We discussed the typical issues that we see routinely after surgery. We discussed the importance of the preoperative meal plan.  He had asymptomatic Covid on January 14. I think we can proceed with surgery on February 2 that'll be about 3-1/2 weeks from his positive tests. He was asymptomatic. He did not require any treatment. He is back to his baseline.  All of his questions were asked and answered  Current Plans Pt Education - EMW_preopbariatric  OSA ON CPAP (G47.33)   PREDIABETES (R73.03)   HYPERCHOLESTEREMIA (E78.00)  Leighton Ruff. [name], MD, FACS General, Bariatric, & Minimally Invasive Surgery [facility], [place]

## 2020-07-19 DIAGNOSIS — Z20822 Contact with and (suspected) exposure to covid-19: Secondary | ICD-10-CM | POA: Diagnosis not present

## 2020-07-25 ENCOUNTER — Encounter (HOSPITAL_COMMUNITY)
Admission: RE | Admit: 2020-07-25 | Discharge: 2020-07-25 | Disposition: A | Discharge status: Home / Self Care | Payer: BC Managed Care – PPO | Source: Ambulatory Visit | Attending: General Surgery | Admitting: General Surgery

## 2020-07-25 ENCOUNTER — Other Ambulatory Visit: Payer: Self-pay

## 2020-07-25 ENCOUNTER — Encounter (HOSPITAL_COMMUNITY): Payer: Self-pay

## 2020-07-25 DIAGNOSIS — Z01812 Encounter for preprocedural laboratory examination: Secondary | ICD-10-CM | POA: Insufficient documentation

## 2020-07-25 LAB — CBC WITH DIFFERENTIAL/PLATELET
Abs Immature Granulocytes: 0.01 10*3/uL (ref 0.00–0.07)
Basophils Absolute: 0 10*3/uL (ref 0.0–0.1)
Basophils Relative: 1 %
Eosinophils Absolute: 0.1 10*3/uL (ref 0.0–0.5)
Eosinophils Relative: 3 %
HCT: 51.6 % (ref 39.0–52.0)
Hemoglobin: 16.6 g/dL (ref 13.0–17.0)
Immature Granulocytes: 0 %
Lymphocytes Relative: 33 %
Lymphs Abs: 1.4 10*3/uL (ref 0.7–4.0)
MCH: 28.2 pg (ref 26.0–34.0)
MCHC: 32.2 g/dL (ref 30.0–36.0)
MCV: 87.8 fL (ref 80.0–100.0)
Monocytes Absolute: 0.4 10*3/uL (ref 0.1–1.0)
Monocytes Relative: 10 %
Neutro Abs: 2.2 10*3/uL (ref 1.7–7.7)
Neutrophils Relative %: 53 %
Platelets: 246 10*3/uL (ref 150–400)
RBC: 5.88 MIL/uL — ABNORMAL HIGH (ref 4.22–5.81)
RDW: 14.6 % (ref 11.5–15.5)
WBC: 4.2 10*3/uL (ref 4.0–10.5)
nRBC: 0 % (ref 0.0–0.2)

## 2020-07-25 LAB — COMPREHENSIVE METABOLIC PANEL
ALT: 31 U/L (ref 0–44)
AST: 23 U/L (ref 15–41)
Albumin: 3.9 g/dL (ref 3.5–5.0)
Alkaline Phosphatase: 57 U/L (ref 38–126)
Anion gap: 8 (ref 5–15)
BUN: 13 mg/dL (ref 6–20)
CO2: 25 mmol/L (ref 22–32)
Calcium: 8.9 mg/dL (ref 8.9–10.3)
Chloride: 103 mmol/L (ref 98–111)
Creatinine, Ser: 1.13 mg/dL (ref 0.61–1.24)
GFR, Estimated: >60 mL/min (ref >60)
Glucose, Bld: 89 mg/dL (ref 70–99)
Potassium: 4.5 mmol/L (ref 3.5–5.1)
Sodium: 136 mmol/L (ref 135–145)
Total Bilirubin: 0.6 mg/dL (ref 0.3–1.2)
Total Protein: 7.5 g/dL (ref 6.5–8.1)

## 2020-07-25 LAB — TYPE AND SCREEN
ABO/RH(D): O POS
Antibody Screen: NEGATIVE

## 2020-07-25 LAB — NO BLOOD PRODUCTS

## 2020-07-25 NOTE — Progress Notes (Signed)
Pt. Sign a blood refusal form

## 2020-07-25 NOTE — Progress Notes (Addendum)
COVID Vaccine Completed: NO Date COVID Vaccine completed: COVID vaccine manufacturer: Pfizer    [facility] [employer]   PCP - Dr. Jacolyn Reedy Cardiologist -   Chest x-ray -03/13/20  EKG - 03/13/20 Stress Test -  ECHO -  Cardiac Cath -  Pacemaker/ICD device last checked:  Sleep Study - Yes CPAP - Yes  Fasting Blood Sugar -  Checks Blood Sugar _____ times a day  Blood Thinner Instructions: Aspirin Instructions: Last Dose:  Anesthesia review:   Patient denies shortness of breath, fever, cough and chest pain at PAT appointment   Patient verbalized understanding of instructions that were given to them at the PAT appointment. Patient was also instructed that they will need to review over the PAT instructions again at home before surgery.

## 2020-07-26 ENCOUNTER — Other Ambulatory Visit (HOSPITAL_COMMUNITY): Payer: BC Managed Care – PPO

## 2020-07-26 NOTE — Progress Notes (Signed)
Pt not tested for covid today(07/26/20) due to pt testing + for covid on 07/07/20(results in Epic). Based on the guidelines the pt is in the 90 day window to not retest. The pt is still expected to quarantine until their procedure. Therefore, the pt can still have the scheduled procedure.

## 2020-07-29 ENCOUNTER — Encounter (HOSPITAL_COMMUNITY): Payer: Self-pay | Admitting: General Surgery

## 2020-07-29 MED ORDER — BUPIVACAINE LIPOSOME 1.3 % IJ SUSP
20.0000 mL | Freq: Once | INTRAMUSCULAR | Status: DC
  Filled 2020-07-29: qty 20

## 2020-07-30 ENCOUNTER — Encounter (HOSPITAL_COMMUNITY)
Admission: RE | Disposition: A | Discharge status: Home / Self Care | Payer: Self-pay | Source: Home / Self Care | Attending: General Surgery

## 2020-07-30 ENCOUNTER — Inpatient Hospital Stay (HOSPITAL_COMMUNITY)
Admission: RE | Admit: 2020-07-30 | Discharge: 2020-07-31 | DRG: 621 | Disposition: A | Discharge status: Home / Self Care | Payer: BC Managed Care – PPO | Attending: General Surgery | Admitting: General Surgery

## 2020-07-30 ENCOUNTER — Inpatient Hospital Stay (HOSPITAL_COMMUNITY): Payer: BC Managed Care – PPO | Admitting: Certified Registered Nurse Anesthetist

## 2020-07-30 ENCOUNTER — Other Ambulatory Visit: Payer: Self-pay

## 2020-07-30 ENCOUNTER — Encounter (HOSPITAL_COMMUNITY): Payer: Self-pay | Admitting: General Surgery

## 2020-07-30 DIAGNOSIS — Z6841 Body Mass Index (BMI) 40.0 and over, adult: Secondary | ICD-10-CM

## 2020-07-30 DIAGNOSIS — G4733 Obstructive sleep apnea (adult) (pediatric): Secondary | ICD-10-CM | POA: Diagnosis not present

## 2020-07-30 DIAGNOSIS — E7800 Pure hypercholesterolemia, unspecified: Secondary | ICD-10-CM | POA: Diagnosis not present

## 2020-07-30 DIAGNOSIS — F17290 Nicotine dependence, other tobacco product, uncomplicated: Secondary | ICD-10-CM | POA: Diagnosis not present

## 2020-07-30 DIAGNOSIS — Z79899 Other long term (current) drug therapy: Secondary | ICD-10-CM | POA: Diagnosis not present

## 2020-07-30 DIAGNOSIS — K660 Peritoneal adhesions (postprocedural) (postinfection): Secondary | ICD-10-CM | POA: Diagnosis not present

## 2020-07-30 DIAGNOSIS — M5441 Lumbago with sciatica, right side: Secondary | ICD-10-CM | POA: Diagnosis not present

## 2020-07-30 DIAGNOSIS — R7303 Prediabetes: Secondary | ICD-10-CM | POA: Diagnosis present

## 2020-07-30 DIAGNOSIS — Z8616 Personal history of COVID-19: Secondary | ICD-10-CM

## 2020-07-30 DIAGNOSIS — G8929 Other chronic pain: Secondary | ICD-10-CM | POA: Diagnosis not present

## 2020-07-30 DIAGNOSIS — Z9884 Bariatric surgery status: Secondary | ICD-10-CM

## 2020-07-30 HISTORY — PX: LAPAROSCOPIC GASTRIC SLEEVE RESECTION: SHX5895

## 2020-07-30 HISTORY — PX: UPPER GI ENDOSCOPY: SHX6162

## 2020-07-30 LAB — HEMOGLOBIN A1C
Hgb A1c MFr Bld: 6 % — ABNORMAL HIGH (ref 4.8–5.6)
Mean Plasma Glucose: 125.5 mg/dL

## 2020-07-30 LAB — GLUCOSE, CAPILLARY
Glucose-Capillary: 127 mg/dL — ABNORMAL HIGH (ref 70–99)
Glucose-Capillary: 129 mg/dL — ABNORMAL HIGH (ref 70–99)
Glucose-Capillary: 164 mg/dL — ABNORMAL HIGH (ref 70–99)

## 2020-07-30 LAB — HEMOGLOBIN AND HEMATOCRIT, BLOOD
HCT: 52.3 % — ABNORMAL HIGH (ref 39.0–52.0)
Hemoglobin: 16.9 g/dL (ref 13.0–17.0)

## 2020-07-30 SURGERY — GASTRECTOMY, SLEEVE, LAPAROSCOPIC
Anesthesia: General | Site: Esophagus

## 2020-07-30 MED ORDER — ENOXAPARIN (LOVENOX) PATIENT EDUCATION KIT
PACK | Freq: Once | Status: DC
  Filled 2020-07-30: qty 1

## 2020-07-30 MED ORDER — ACETAMINOPHEN 500 MG PO TABS
1000.0000 mg | ORAL_TABLET | Freq: Three times a day (TID) | ORAL | Status: DC
  Administered 2020-07-30 – 2020-07-31 (×3): 1000 mg via ORAL
  Filled 2020-07-30 (×3): qty 2

## 2020-07-30 MED ORDER — DEXAMETHASONE SODIUM PHOSPHATE 4 MG/ML IJ SOLN: 4.0000 mg | INTRAMUSCULAR | Status: DC

## 2020-07-30 MED ORDER — PANTOPRAZOLE SODIUM 40 MG IV SOLR
40.0000 mg | Freq: Every day | INTRAVENOUS | Status: DC
  Administered 2020-07-30: 40 mg via INTRAVENOUS
  Filled 2020-07-30: qty 40

## 2020-07-30 MED ORDER — PROPOFOL 10 MG/ML IV BOLUS
INTRAVENOUS | Status: DC | PRN
  Administered 2020-07-30: 200 mg via INTRAVENOUS

## 2020-07-30 MED ORDER — OXYCODONE HCL 5 MG/5ML PO SOLN
5.0000 mg | Freq: Four times a day (QID) | ORAL | Status: DC | PRN
  Administered 2020-07-30 – 2020-07-31 (×2): 5 mg via ORAL
  Filled 2020-07-30 (×2): qty 5

## 2020-07-30 MED ORDER — HEPARIN SODIUM (PORCINE) 5000 UNIT/ML IJ SOLN
5000.0000 [IU] | INTRAMUSCULAR | Status: AC
  Administered 2020-07-30: 5000 [IU] via SUBCUTANEOUS
  Filled 2020-07-30: qty 1

## 2020-07-30 MED ORDER — PROPOFOL 10 MG/ML IV BOLUS
INTRAVENOUS | Status: AC
  Filled 2020-07-30: qty 20

## 2020-07-30 MED ORDER — INSULIN ASPART 100 UNIT/ML ~~LOC~~ SOLN
0.0000 [IU] | SUBCUTANEOUS | Status: DC
  Administered 2020-07-30: 2 [IU] via SUBCUTANEOUS
  Administered 2020-07-30: 3 [IU] via SUBCUTANEOUS
  Administered 2020-07-31: 2 [IU] via SUBCUTANEOUS

## 2020-07-30 MED ORDER — AMISULPRIDE (ANTIEMETIC) 5 MG/2ML IV SOLN: 10.0000 mg | Freq: Once | INTRAVENOUS | Status: DC | PRN

## 2020-07-30 MED ORDER — ONDANSETRON HCL 4 MG/2ML IJ SOLN
INTRAMUSCULAR | Status: DC | PRN
  Administered 2020-07-30: 4 mg via INTRAVENOUS

## 2020-07-30 MED ORDER — LIDOCAINE 2% (20 MG/ML) 5 ML SYRINGE
INTRAMUSCULAR | Status: DC | PRN
  Administered 2020-07-30: 100 mg via INTRAVENOUS

## 2020-07-30 MED ORDER — HYDROMORPHONE HCL 1 MG/ML IJ SOLN
INTRAMUSCULAR | Status: AC
  Filled 2020-07-30: qty 1

## 2020-07-30 MED ORDER — MEPERIDINE HCL 50 MG/ML IJ SOLN: 6.2500 mg | INTRAMUSCULAR | Status: DC | PRN

## 2020-07-30 MED ORDER — LABETALOL HCL 5 MG/ML IV SOLN
INTRAVENOUS | Status: AC
  Administered 2020-07-30: 10 mg via INTRAVENOUS
  Filled 2020-07-30: qty 4

## 2020-07-30 MED ORDER — FENTANYL CITRATE (PF) 100 MCG/2ML IJ SOLN
INTRAMUSCULAR | Status: AC
  Filled 2020-07-30: qty 2

## 2020-07-30 MED ORDER — HYDRALAZINE HCL 20 MG/ML IJ SOLN: 10.0000 mg | Freq: Once | INTRAMUSCULAR | Status: AC

## 2020-07-30 MED ORDER — ONDANSETRON HCL 4 MG/2ML IJ SOLN: 4.0000 mg | Freq: Four times a day (QID) | INTRAMUSCULAR | Status: DC | PRN

## 2020-07-30 MED ORDER — DIPHENHYDRAMINE HCL 50 MG/ML IJ SOLN: 12.5000 mg | Freq: Three times a day (TID) | INTRAMUSCULAR | Status: DC | PRN

## 2020-07-30 MED ORDER — GABAPENTIN 300 MG PO CAPS
300.0000 mg | ORAL_CAPSULE | ORAL | Status: AC
  Administered 2020-07-30: 300 mg via ORAL
  Filled 2020-07-30: qty 1

## 2020-07-30 MED ORDER — OXYCODONE HCL 5 MG/5ML PO SOLN: 5.0000 mg | Freq: Once | ORAL | Status: DC | PRN

## 2020-07-30 MED ORDER — KETAMINE HCL 10 MG/ML IJ SOLN
INTRAMUSCULAR | Status: DC | PRN
  Administered 2020-07-30: 50 mg via INTRAVENOUS

## 2020-07-30 MED ORDER — HYDROMORPHONE HCL 1 MG/ML IJ SOLN
0.2500 mg | INTRAMUSCULAR | Status: DC | PRN
  Administered 2020-07-30 (×3): 0.5 mg via INTRAVENOUS

## 2020-07-30 MED ORDER — LABETALOL HCL 5 MG/ML IV SOLN: 10.0000 mg | Freq: Once | INTRAVENOUS | Status: AC

## 2020-07-30 MED ORDER — LIDOCAINE 2% (20 MG/ML) 5 ML SYRINGE
INTRAMUSCULAR | Status: DC | PRN
  Administered 2020-07-30: 1.5 mg/kg/h via INTRAVENOUS

## 2020-07-30 MED ORDER — ENSURE MAX PROTEIN PO LIQD
2.0000 [oz_av] | ORAL | Status: DC
  Administered 2020-07-31 (×5): 2 [oz_av] via ORAL

## 2020-07-30 MED ORDER — HYDRALAZINE HCL 20 MG/ML IJ SOLN
10.0000 mg | Freq: Once | INTRAMUSCULAR | Status: AC
  Administered 2020-07-30: 10 mg via INTRAVENOUS

## 2020-07-30 MED ORDER — MORPHINE SULFATE (PF) 2 MG/ML IV SOLN: 1.0000 mg | INTRAVENOUS | Status: DC | PRN

## 2020-07-30 MED ORDER — KETAMINE HCL 10 MG/ML IJ SOLN
INTRAMUSCULAR | Status: AC
  Filled 2020-07-30: qty 1

## 2020-07-30 MED ORDER — SUCCINYLCHOLINE CHLORIDE 200 MG/10ML IV SOSY
PREFILLED_SYRINGE | INTRAVENOUS | Status: AC
  Filled 2020-07-30: qty 10

## 2020-07-30 MED ORDER — ONDANSETRON HCL 4 MG/2ML IJ SOLN
INTRAMUSCULAR | Status: AC
  Filled 2020-07-30: qty 2

## 2020-07-30 MED ORDER — PROMETHAZINE HCL 25 MG/ML IJ SOLN: 12.5000 mg | Freq: Four times a day (QID) | INTRAMUSCULAR | Status: DC | PRN

## 2020-07-30 MED ORDER — SODIUM CHLORIDE (PF) 0.9 % IJ SOLN
INTRAMUSCULAR | Status: DC | PRN
  Administered 2020-07-30: 50 mL

## 2020-07-30 MED ORDER — GABAPENTIN 100 MG PO CAPS
200.0000 mg | ORAL_CAPSULE | Freq: Two times a day (BID) | ORAL | Status: DC
  Administered 2020-07-30 – 2020-07-31 (×2): 200 mg via ORAL
  Filled 2020-07-30 (×2): qty 2

## 2020-07-30 MED ORDER — SUCCINYLCHOLINE CHLORIDE 200 MG/10ML IV SOSY
PREFILLED_SYRINGE | INTRAVENOUS | Status: DC | PRN
  Administered 2020-07-30: 200 mg via INTRAVENOUS

## 2020-07-30 MED ORDER — CHLORHEXIDINE GLUCONATE 4 % EX LIQD: 60.0000 mL | Freq: Once | CUTANEOUS | Status: DC

## 2020-07-30 MED ORDER — CHLORHEXIDINE GLUCONATE 0.12 % MT SOLN
15.0000 mL | Freq: Once | OROMUCOSAL | Status: AC
  Administered 2020-07-30: 15 mL via OROMUCOSAL

## 2020-07-30 MED ORDER — ACETAMINOPHEN 500 MG PO TABS
1000.0000 mg | ORAL_TABLET | ORAL | Status: AC
  Administered 2020-07-30: 1000 mg via ORAL
  Filled 2020-07-30: qty 2

## 2020-07-30 MED ORDER — STERILE WATER FOR IRRIGATION IR SOLN
Status: DC | PRN
  Administered 2020-07-30: 1000 mL

## 2020-07-30 MED ORDER — POTASSIUM CHLORIDE IN NACL 20-0.45 MEQ/L-% IV SOLN
INTRAVENOUS | Status: DC
  Filled 2020-07-30 (×4): qty 1000

## 2020-07-30 MED ORDER — SIMETHICONE 80 MG PO CHEW
80.0000 mg | CHEWABLE_TABLET | Freq: Four times a day (QID) | ORAL | Status: DC | PRN
  Administered 2020-07-31 (×2): 80 mg via ORAL
  Filled 2020-07-30 (×2): qty 1

## 2020-07-30 MED ORDER — ORAL CARE MOUTH RINSE: 15.0000 mL | Freq: Once | OROMUCOSAL | Status: AC

## 2020-07-30 MED ORDER — ACETAMINOPHEN 160 MG/5ML PO SOLN: 1000.0000 mg | Freq: Three times a day (TID) | ORAL | Status: DC

## 2020-07-30 MED ORDER — LABETALOL HCL 5 MG/ML IV SOLN
10.0000 mg | Freq: Once | INTRAVENOUS | Status: AC
  Administered 2020-07-30: 10 mg via INTRAVENOUS

## 2020-07-30 MED ORDER — DEXAMETHASONE SODIUM PHOSPHATE 4 MG/ML IJ SOLN
INTRAMUSCULAR | Status: DC | PRN
  Administered 2020-07-30: 8 mg via INTRAVENOUS

## 2020-07-30 MED ORDER — SUGAMMADEX SODIUM 500 MG/5ML IV SOLN
INTRAVENOUS | Status: DC | PRN
  Administered 2020-07-30: 400 mg via INTRAVENOUS

## 2020-07-30 MED ORDER — 0.9 % SODIUM CHLORIDE (POUR BTL) OPTIME
TOPICAL | Status: DC | PRN
  Administered 2020-07-30: 1000 mL

## 2020-07-30 MED ORDER — MIDAZOLAM HCL 5 MG/5ML IJ SOLN
INTRAMUSCULAR | Status: DC | PRN
  Administered 2020-07-30: 2 mg via INTRAVENOUS

## 2020-07-30 MED ORDER — LACTATED RINGERS IR SOLN
Status: DC | PRN
  Administered 2020-07-30: 1000 mL

## 2020-07-30 MED ORDER — OXYCODONE HCL 5 MG PO TABS: 5.0000 mg | ORAL_TABLET | Freq: Once | ORAL | Status: DC | PRN

## 2020-07-30 MED ORDER — BUPIVACAINE LIPOSOME 1.3 % IJ SUSP
INTRAMUSCULAR | Status: DC | PRN
  Administered 2020-07-30: 20 mL

## 2020-07-30 MED ORDER — APREPITANT 40 MG PO CAPS
40.0000 mg | ORAL_CAPSULE | ORAL | Status: AC
  Administered 2020-07-30: 40 mg via ORAL
  Filled 2020-07-30: qty 1

## 2020-07-30 MED ORDER — SCOPOLAMINE 1 MG/3DAYS TD PT72
1.0000 | MEDICATED_PATCH | TRANSDERMAL | Status: DC
  Administered 2020-07-30: 1.5 mg via TRANSDERMAL
  Filled 2020-07-30: qty 1

## 2020-07-30 MED ORDER — FENTANYL CITRATE (PF) 250 MCG/5ML IJ SOLN
INTRAMUSCULAR | Status: DC | PRN
  Administered 2020-07-30 (×2): 100 ug via INTRAVENOUS

## 2020-07-30 MED ORDER — HYDRALAZINE HCL 20 MG/ML IJ SOLN
INTRAMUSCULAR | Status: AC
  Administered 2020-07-30: 10 mg via INTRAVENOUS
  Filled 2020-07-30: qty 1

## 2020-07-30 MED ORDER — ROCURONIUM BROMIDE 10 MG/ML (PF) SYRINGE
PREFILLED_SYRINGE | INTRAVENOUS | Status: DC | PRN
  Administered 2020-07-30: 65 mg via INTRAVENOUS
  Administered 2020-07-30: 5 mg via INTRAVENOUS
  Administered 2020-07-30: 20 mg via INTRAVENOUS

## 2020-07-30 MED ORDER — HYDRALAZINE HCL 20 MG/ML IJ SOLN: 10.0000 mg | INTRAMUSCULAR | Status: DC | PRN

## 2020-07-30 MED ORDER — SODIUM CHLORIDE 0.9 % IV SOLN
2.0000 g | INTRAVENOUS | Status: AC
  Administered 2020-07-30: 2 g via INTRAVENOUS
  Filled 2020-07-30: qty 2

## 2020-07-30 MED ORDER — PROMETHAZINE HCL 25 MG/ML IJ SOLN: 6.2500 mg | INTRAMUSCULAR | Status: DC | PRN

## 2020-07-30 MED ORDER — LACTATED RINGERS IV SOLN: INTRAVENOUS | Status: DC

## 2020-07-30 MED ORDER — LIDOCAINE HCL (PF) 2 % IJ SOLN
INTRAMUSCULAR | Status: AC
  Filled 2020-07-30: qty 15

## 2020-07-30 MED ORDER — MIDAZOLAM HCL 2 MG/2ML IJ SOLN
INTRAMUSCULAR | Status: AC
  Filled 2020-07-30: qty 2

## 2020-07-30 MED ORDER — ENOXAPARIN SODIUM 30 MG/0.3ML ~~LOC~~ SOLN
30.0000 mg | Freq: Two times a day (BID) | SUBCUTANEOUS | Status: DC
  Administered 2020-07-30 – 2020-07-31 (×2): 30 mg via SUBCUTANEOUS
  Filled 2020-07-30 (×3): qty 0.3

## 2020-07-30 MED ORDER — SUGAMMADEX SODIUM 500 MG/5ML IV SOLN
INTRAVENOUS | Status: AC
  Filled 2020-07-30: qty 5

## 2020-07-30 MED ORDER — DEXAMETHASONE SODIUM PHOSPHATE 10 MG/ML IJ SOLN
INTRAMUSCULAR | Status: AC
  Filled 2020-07-30: qty 1

## 2020-07-30 SURGICAL SUPPLY — 70 items
APPLICATOR COTTON TIP 6 STRL (MISCELLANEOUS) IMPLANT
APPLICATOR COTTON TIP 6IN STRL (MISCELLANEOUS)
APPLIER CLIP ROT 10 11.4 M/L (STAPLE)
APPLIER CLIP ROT 13.4 12 LRG (CLIP)
BENZOIN TINCTURE PRP APPL 2/3 (GAUZE/BANDAGES/DRESSINGS) ×3 IMPLANT
BLADE SURG SZ11 CARB STEEL (BLADE) ×3 IMPLANT
BNDG ADH 1X3 SHEER STRL LF (GAUZE/BANDAGES/DRESSINGS) ×18 IMPLANT
CABLE HIGH FREQUENCY MONO STRZ (ELECTRODE) ×3 IMPLANT
CHLORAPREP W/TINT 26 (MISCELLANEOUS) ×6 IMPLANT
CLIP APPLIE ROT 10 11.4 M/L (STAPLE) IMPLANT
CLIP APPLIE ROT 13.4 12 LRG (CLIP) IMPLANT
COVER SURGICAL LIGHT HANDLE (MISCELLANEOUS) ×3 IMPLANT
COVER WAND RF STERILE (DRAPES) IMPLANT
DECANTER SPIKE VIAL GLASS SM (MISCELLANEOUS) ×3 IMPLANT
DEVICE SUT QUICK LOAD TK 5 (STAPLE) IMPLANT
DEVICE SUT TI-KNOT TK 5X26 (MISCELLANEOUS) IMPLANT
DEVICE SUTURE ENDOST 10MM (ENDOMECHANICALS) IMPLANT
DISSECTOR BLUNT TIP ENDO 5MM (MISCELLANEOUS) IMPLANT
DRAPE UTILITY XL STRL (DRAPES) ×6 IMPLANT
DRSG COVADERM PLUS 2X2 (GAUZE/BANDAGES/DRESSINGS) ×3 IMPLANT
DRSG TEGADERM 2-3/8X2-3/4 SM (GAUZE/BANDAGES/DRESSINGS) ×3 IMPLANT
ELECT L-HOOK LAP 45CM DISP (ELECTROSURGICAL)
ELECT REM PT RETURN 15FT ADLT (MISCELLANEOUS) ×3 IMPLANT
ELECTRODE L-HOOK LAP 45CM DISP (ELECTROSURGICAL) IMPLANT
GAUZE SPONGE 2X2 8PLY STRL LF (GAUZE/BANDAGES/DRESSINGS) IMPLANT
GAUZE SPONGE 4X4 12PLY STRL (GAUZE/BANDAGES/DRESSINGS) IMPLANT
GLOVE INDICATOR 8.0 STRL GRN (GLOVE) ×3 IMPLANT
GLOVE SURG ENC MOIS LTX SZ7.5 (GLOVE) ×3 IMPLANT
GOWN STRL REUS W/TWL XL LVL3 (GOWN DISPOSABLE) ×9 IMPLANT
GRASPER SUT TROCAR 14GX15 (MISCELLANEOUS) ×3 IMPLANT
KIT BASIN OR (CUSTOM PROCEDURE TRAY) ×3 IMPLANT
KIT TURNOVER KIT A (KITS) IMPLANT
MARKER SKIN DUAL TIP RULER LAB (MISCELLANEOUS) ×3 IMPLANT
MAT PREVALON FULL STRYKER (MISCELLANEOUS) ×3 IMPLANT
NEEDLE SPNL 22GX3.5 QUINCKE BK (NEEDLE) ×3 IMPLANT
PACK UNIVERSAL I (CUSTOM PROCEDURE TRAY) ×3 IMPLANT
PENCIL SMOKE EVACUATOR (MISCELLANEOUS) IMPLANT
RELOAD STAPLER 60MM BLK (STAPLE) IMPLANT
RELOAD STAPLER BLUE 60MM (STAPLE) ×2 IMPLANT
RELOAD STAPLER GOLD 60MM (STAPLE) ×4 IMPLANT
RELOAD STAPLER GREEN 60MM (STAPLE) ×4 IMPLANT
SCISSORS LAP 5X45 EPIX DISP (ENDOMECHANICALS) IMPLANT
SEALANT SURGICAL APPL DUAL CAN (MISCELLANEOUS) IMPLANT
SET IRRIG TUBING LAPAROSCOPIC (IRRIGATION / IRRIGATOR) ×3 IMPLANT
SET TUBE SMOKE EVAC HIGH FLOW (TUBING) ×3 IMPLANT
SHEARS HARMONIC ACE PLUS 45CM (MISCELLANEOUS) ×3 IMPLANT
SLEEVE GASTRECTOMY 40FR VISIGI (MISCELLANEOUS) ×3 IMPLANT
SLEEVE XCEL OPT CAN 5 100 (ENDOMECHANICALS) ×9 IMPLANT
SOL ANTI FOG 6CC (MISCELLANEOUS) ×2 IMPLANT
SOLUTION ANTI FOG 6CC (MISCELLANEOUS) ×1
SPONGE GAUZE 2X2 STER 10/PKG (GAUZE/BANDAGES/DRESSINGS)
SPONGE LAP 18X18 RF (DISPOSABLE) ×3 IMPLANT
STAPLER ECHELON BIOABSB 60 FLE (MISCELLANEOUS) ×12 IMPLANT
STAPLER ECHELON LONG 60 440 (INSTRUMENTS) ×3 IMPLANT
STAPLER RELOAD 60MM BLK (STAPLE)
STAPLER RELOAD BLUE 60MM (STAPLE) ×3
STAPLER RELOAD GOLD 60MM (STAPLE) ×6
STAPLER RELOAD GREEN 60MM (STAPLE) ×6
STRIP CLOSURE SKIN 1/2X4 (GAUZE/BANDAGES/DRESSINGS) ×3 IMPLANT
SUT MNCRL AB 4-0 PS2 18 (SUTURE) ×3 IMPLANT
SUT SURGIDAC NAB ES-9 0 48 120 (SUTURE) IMPLANT
SUT VICRYL 0 TIES 12 18 (SUTURE) ×3 IMPLANT
SYR 20ML LL LF (SYRINGE) ×3 IMPLANT
SYR 50ML LL SCALE MARK (SYRINGE) ×3 IMPLANT
TOWEL OR 17X26 10 PK STRL BLUE (TOWEL DISPOSABLE) ×3 IMPLANT
TOWEL OR NON WOVEN STRL DISP B (DISPOSABLE) ×3 IMPLANT
TROCAR BLADELESS 15MM (ENDOMECHANICALS) ×3 IMPLANT
TROCAR BLADELESS OPT 5 100 (ENDOMECHANICALS) ×3 IMPLANT
TUBING CONNECTING 10 (TUBING) ×6 IMPLANT
TUBING ENDO SMARTCAP (MISCELLANEOUS) ×3 IMPLANT

## 2020-07-30 NOTE — Progress Notes (Signed)
Discussed post op day goals with patient including ambulation, IS, diet progression, pain, and nausea control.  BSTOP education provided including BSTOP information guide, "Guide for Pain Management after your Bariatric Procedure".  Questions answered. 

## 2020-07-30 NOTE — Op Note (Signed)
07/30/2020 Lawrence Dalton Dec 25, 1970 [id number]   PRE-OPERATIVE DIAGNOSIS:     Severe obesity BMI 50   OSA on CPAP   Prediabetes   Chronic low back pain with right-sided sciatica   Pure hypercholesterolemia  POST-OPERATIVE DIAGNOSIS:  Same + omental adhesions to prior umbilical hernia repair  PROCEDURE:  Procedure(s): LAPAROSCOPIC SLEEVE GASTRECTOMY R UPPER GI ENDOSCOPY  SURGEON:  Surgeon(s): Atilano Ina, MD FACS FASMBS  ASSISTANTS: Feliciana Rossetti, MD, FACS  ANESTHESIA:   general  DRAINS: none   BOUGIE: 40 fr ViSiGi  LOCAL MEDICATIONS USED:   Exparel  EBL: Minimal  SPECIMEN:  Source of Specimen:  Greater curvature of stomach  DISPOSITION OF SPECIMEN:  PATHOLOGY  COUNTS:  YES  INDICATION FOR PROCEDURE: This is a very pleasant 50 y.o.-year-old morbidly obese male who has had unsuccessful attempts for sustained weight loss. The patient presents today for a planned laparoscopic sleeve gastrectomy with upper endoscopy. We have discussed the risk and benefits of the procedure extensively preoperatively. Please see my separate notes.  PROCEDURE: After obtaining informed consent and receiving 5000 units of subcutaneous heparin, the patient was brought to the operating room at [facility] and placed supine on the operating room table. General endotracheal anesthesia was established. Sequential compression devices were placed. A orogastric tube was placed. The patient's abdomen was prepped and draped in the usual standard surgical fashion. The patient received preoperative IV antibiotic. A surgical timeout was performed. ERAS protocol used.   Access to the abdomen was achieved using a 5 mm 0 laparoscope thru a 5 mm trocar In the left upper Quadrant 2 fingerbreadths below the left subcostal margin using the Optiview technique. Pneumoperitoneum was smoothly established up to 15 mm of mercury. The laparoscope was advanced and the abdominal cavity was surveilled. The patient was  then placed in reverse Trendelenburg.  The patient had some omental adhesions to a prior umbilical hernia repair with mesh.  These were left alone.  A 5 mm trocar was placed slightly above and to the left of the umbilicus under direct visualization.  The [facility] liver retractor was placed under the left lobe of the liver through a 5 mm trocar incision site in the subxiphoid position. A 5 mm trocar was placed in the lateral right upper quadrant along with a 15 mm trocar in the mid right abdomen. A final 5 mm trocar was placed in the lateral LUQ.  All under direct visualization after exparel had been infiltrated in bilateral lateral upper abdominal walls as a TAP block.  The stomach was inspected. It was completely decompressed and the orogastric tube was removed.  There was no anterior dimple that was obviously visible.  His preoperative upper GI was normal.  Patient had a fairly thick appearing stomach.   We identified the pylorus and measured 6 cm proximal to the pylorus and identified an area of where we would start taking down the short gastric vessels. Harmonic scalpel was used to take down the short gastric vessels along the greater curvature of the stomach. We were able to enter the lesser sac. We continued to march along the greater curvature of the stomach taking down the short gastrics. As we approached the gastrosplenic ligament we took care in this area not to injure the spleen. We were able to take down the entire gastrosplenic ligament. We then mobilized the fundus away from the left crus of diaphragm. There were not any significant posterior gastric avascular attachments. This left the stomach completely mobilized. No vessels  had been taken down along the lesser curvature of the stomach.  We then reidentified the pylorus. A 40Fr ViSiGi was then placed in the oropharynx and advanced down into the stomach and placed in the distal antrum and positioned along the lesser curvature. It was  placed under suction which secured the 40Fr ViSiGi in place along the lesser curve. Then using the [employer] echelon 60 mm stapler with a green load with Seamguard, I placed a stapler along the antrum approximately 5 cm from the pylorus. The stapler was angled so that there is ample room at the angularis incisura. I then fired the first staple load after inspecting it posteriorly to ensure adequate space both anteriorly and posteriorly. At this point I still was not completely past the angularis so with another green load with Seamguard, I placed the stapler in position just inside the prior stapleline. We then rotated the stomach to insure that there was adequate anteriorly as well as posteriorly. The stapler was then fired.  At this point I started using 60 mm gold load staple cartridges x2 with Seamguard. The echelon stapler was then repositioned with a 60 mm blue load with Seamguard and we continued to march up along the ViSiGi. My assistant was holding traction along the greater curvature stomach along the cauterized short gastric vessels ensuring that the stomach was symmetrically retracted. Prior to each firing of the staple, we rotated the stomach to ensure that there is adequate stomach left.  As we approached the fundus, I used 60 mm blue cartridge with Seamguard aiming  lateral to the GE junction after mobilizing some of the esophageal fat pad.  The sleeve was inspected. There is no evidence of cork screw. The staple line appeared hemostatic. The CRNA inflated the ViSiGi to the green zone and the upper abdomen was flooded with saline. There were no bubbles. The sleeve was decompressed and the ViSiGi removed. My assistant scrubbed out and performed an upper endoscopy. The sleeve easily distended with air and the scope was easily advanced to the pylorus. There is no evidence of internal bleeding or cork screwing. There was no narrowing at the angularis. There is no evidence of bubbles. Please see his  operative note for further details. The gastric sleeve was decompressed and the endoscope was removed.  I reinspected the sleeve staple line.  There was a scant amount of oozing from 2 locations mainly in the distal sleeve and midportion.  Because he refuses blood products I decided to be extra careful and went ahead and placed several clips on these 2 areas.  A total of 5 clips were placed.  There is no other evidence of oozing along the staple line.  The greater curvature the stomach was grasped with a laparoscopic grasper and removed from the 15 mm trocar site.  The liver retractor was removed. I then closed the 15 mm trocar site with 2 interrupted 0 Vicryl sutures through the fascia using the endoclose. The closure was viewed laparoscopically and it was airtight. Remaining Exparel was then infiltrated in the preperitoneal spaces around the trocar sites. Pneumoperitoneum was released. All trocar sites were closed with a 4-0 Monocryl in a subcuticular fashion followed by the application of  steri-strips, and bandaids. The patient was extubated and taken to the recovery room in stable condition. All needle, instrument, and sponge counts were correct x2. There are no immediate complications  (2) 60 mm green with Seamguard (2) 60 mm gold with seamguard (2) 60 mm blue with 1  seamguard  PLAN OF CARE: Admit to inpatient   PATIENT DISPOSITION:  PACU - hemodynamically stable.   Delay start of Pharmacological VTE agent (>24hrs) due to surgical blood loss or risk of bleeding:  no  Mary Sella. Andrey Campanile, MD, FACS FASMBS General, Bariatric, & Minimally Invasive Surgery [facility], [place]

## 2020-07-30 NOTE — Progress Notes (Signed)
PHARMACY CONSULT FOR:  Risk Assessment for Post-Discharge VTE Following Bariatric Surgery  Post-Discharge VTE Risk Assessment: This patient's probability of 30-day post-discharge VTE is increased due to the factors marked: X  Male    Age >/=60 years   X BMI >/=50 kg/m2    CHF    Dyspnea at Rest    Paraplegia   X Non-gastric-band surgery    Operation Time >/=3 hr    Return to OR     Length of Stay >/= 3 d   Hx of VTE   Hypercoagulable condition   Significant venous stasis       Predicted probability of 30-day post-discharge VTE: 0.51%  Other patient-specific factors to consider: N/A   Recommendation for Discharge: Enoxaparin 60 mg SQ q12h x 2 weeks post-discharge     Lawrence Dalton is a 50 y.o. male who underwent laparoscopic sleeve gastrectomy on 07/30/2020.    Case start: 0758 Case end: 0919   Allergies  Allergen Reactions  . Other     Blood refusal    Patient Measurements: Height: 6' (182.9 cm) IBW/kg (Calculated) : 77.6 Body mass index is 50.18 kg/m.  Recent Labs    07/30/20 1041  HGB 16.9  HCT 52.3*   Estimated Creatinine Clearance: 128.6 mL/min (by C-G formula based on SCr of 1.13 mg/dL).    Past Medical History:  Diagnosis Date  . Dental bridge present    lower  . Sleep apnea    C-pap  . Umbilical hernia 03/2016     Medications Prior to Admission  Medication Sig Dispense Refill Last Dose  . cetirizine-pseudoephedrine (ZYRTEC-D) 5-120 MG tablet Take 1 tablet by mouth daily as needed (sinus issues.).   Past Week at Unknown time  . ibuprofen (ADVIL) 200 MG tablet Take 600 mg by mouth every 8 (eight) hours as needed (for pain.).   Past Month at Unknown time  . Multiple Vitamin (MULTIVITAMIN WITH MINERALS) TABS tablet Take 1 tablet by mouth daily.   Past Week at Unknown time  . testosterone cypionate (DEPOTESTOTERONE CYPIONATE) 100 MG/ML injection Inject 125 mg into the muscle every Friday. For IM use only   07/25/2020 at Unknown time        Lawrence Dalton 07/30/2020,12:11 PM

## 2020-07-30 NOTE — Op Note (Signed)
Preoperative diagnosis: laparoscopic sleeve gastrectomy  Postoperative diagnosis: Same   Procedure: Upper endoscopy   Surgeon: Kristi Norment, M.D.  Anesthesia: Gen.   Indications for procedure: This patient was undergoing a laparoscopic sleeve gastrectomy.   Description of procedure: The endoscopy was placed in the mouth and into the oropharynx and under endoscopic vision it was advanced to the esophagogastric junction. The pouch was insufflated and no bleeding or bubbles were seen. The GEJ was identified at 43cm from the teeth. No bleeding or leaks were detected. The scope was withdrawn without difficulty.   Idamay Hosein, M.D. General, Bariatric, & Minimally Invasive Surgery [facility]    

## 2020-07-30 NOTE — Anesthesia Procedure Notes (Signed)
Procedure Name: Intubation Date/Time: 07/30/2020 7:29 AM Performed by: [place] Taquan Bralley C, CRNA Pre-anesthesia Checklist: Patient identified, Emergency Drugs available, Suction available and Patient being monitored Patient Re-evaluated:Patient Re-evaluated prior to induction Oxygen Delivery Method: Circle system utilized Preoxygenation: Pre-oxygenation with 100% oxygen Induction Type: IV induction Ventilation: Two handed mask ventilation required and Oral airway inserted - appropriate to patient size Laryngoscope Size: Mac and 4 Grade View: Grade III Tube type: Oral Tube size: 7.5 mm Number of attempts: 1 Airway Equipment and Method: Stylet and Oral airway Placement Confirmation: ETT inserted through vocal cords under direct vision,  positive ETCO2 and breath sounds checked- equal and bilateral Secured at: 23 cm Tube secured with: Tape Dental Injury: Teeth and Oropharynx as per pre-operative assessment

## 2020-07-30 NOTE — Anesthesia Preprocedure Evaluation (Signed)
Anesthesia Evaluation  Patient identified by MRN, date of birth, ID band Patient awake    Reviewed: Allergy & Precautions, NPO status , Patient's Chart, lab work & pertinent test results  History of Anesthesia Complications Negative for: history of anesthetic complications  Airway Mallampati: III  TM Distance: >3 FB Neck ROM: Full    Dental no notable dental hx. (+) Dental Advisory Given   Pulmonary sleep apnea and Oxygen sleep apnea , Current Smoker and Patient abstained from smoking.,    Pulmonary exam normal breath sounds clear to auscultation       Cardiovascular negative cardio ROS Normal cardiovascular exam Rhythm:Regular Rate:Normal     Neuro/Psych negative neurological ROS  negative psych ROS   GI/Hepatic negative GI ROS, Neg liver ROS,   Endo/Other  Morbid obesity  Renal/GU      Musculoskeletal   Abdominal (+) + obese,   Peds  Hematology   Anesthesia Other Findings   Reproductive/Obstetrics                             Anesthesia Physical  Anesthesia Plan  ASA: III  Anesthesia Plan: General   Post-op Pain Management:    Induction: Intravenous  PONV Risk Score and Plan: 1 and Ondansetron and Treatment may vary due to age or medical condition  Airway Management Planned: Oral ETT  Additional Equipment:   Intra-op Plan:   Post-operative Plan: Extubation in OR  Informed Consent: I have reviewed the patients History and Physical, chart, labs and discussed the procedure including the risks, benefits and alternatives for the proposed anesthesia with the patient or authorized representative who has indicated his/her understanding and acceptance.     Dental advisory given  Plan Discussed with: CRNA, Anesthesiologist and Surgeon  Anesthesia Plan Comments:         Anesthesia Quick Evaluation

## 2020-07-30 NOTE — Anesthesia Postprocedure Evaluation (Signed)
Anesthesia Post Note  Patient: Lawrence Dalton  Procedure(s) Performed: LAPAROSCOPIC GASTRIC SLEEVE RESECTION (N/A Abdomen) UPPER GI ENDOSCOPY (N/A Esophagus)     Patient location during evaluation: PACU Anesthesia Type: General Level of consciousness: awake and alert Pain management: pain level controlled Vital Signs Assessment: post-procedure vital signs reviewed and stable Respiratory status: spontaneous breathing, nonlabored ventilation and respiratory function stable Cardiovascular status: blood pressure returned to baseline and stable Postop Assessment: no apparent nausea or vomiting Anesthetic complications: no   No complications documented.  Last Vitals:  Vitals:   07/30/20 1115 07/30/20 1130  BP: (!) 165/108 (!) 162/99  Pulse: (!) 103 (!) 105  Resp: (!) 23 11  Temp:  36.9 C  SpO2: 99% 100%    Last Pain:  Vitals:   07/30/20 1130  TempSrc:   PainSc: 5                  Lowella Curb

## 2020-07-30 NOTE — Interval H&P Note (Signed)
History and Physical Interval Note:  07/30/2020 7:14 AM  Lawrence Dalton  has presented today for surgery, with the diagnosis of morbid obesity.  The various methods of treatment have been discussed with the patient and family. After consideration of risks, benefits and other options for treatment, the patient has consented to  Procedure(s): LAPAROSCOPIC GASTRIC SLEEVE RESECTION (N/A) UPPER GI ENDOSCOPY (N/A) as a surgical intervention.  The patient's history has been reviewed, patient examined, no change in status, stable for surgery.  I have reviewed the patient's chart and labs.  Questions were answered to the patient's satisfaction.    Mary Sella. Andrey Campanile, MD, FACS General, Bariatric, & Minimally Invasive Surgery [facility]  Gaynelle Adu

## 2020-07-30 NOTE — Transfer of Care (Signed)
Immediate Anesthesia Transfer of Care Note  Patient: Lawrence Dalton  Procedure(s) Performed: LAPAROSCOPIC GASTRIC SLEEVE RESECTION (N/A Abdomen) UPPER GI ENDOSCOPY (N/A Esophagus)  Patient Location: PACU  Anesthesia Type:General  Level of Consciousness: awake, alert  and oriented  Airway & Oxygen Therapy: Patient Spontanous Breathing and Patient connected to face mask oxygen  Post-op Assessment: Report given to RN and Post -op Vital signs reviewed and stable  Post vital signs: Reviewed and stable  Last Vitals:  Vitals Value Taken Time  BP 164/99 07/30/20 0932  Temp    Pulse 102 07/30/20 0934  Resp 10 07/30/20 0934  SpO2 100 % 07/30/20 0934  Vitals shown include unvalidated device data.  Last Pain:  Vitals:   07/30/20 0603  TempSrc: Oral      Patients Stated Pain Goal: 4 (07/30/20 0542)  Complications: No complications documented.

## 2020-07-31 ENCOUNTER — Encounter (HOSPITAL_COMMUNITY): Payer: Self-pay | Admitting: General Surgery

## 2020-07-31 ENCOUNTER — Other Ambulatory Visit (HOSPITAL_COMMUNITY): Payer: Self-pay | Admitting: General Surgery

## 2020-07-31 LAB — SURGICAL PATHOLOGY

## 2020-07-31 LAB — CBC WITH DIFFERENTIAL/PLATELET
Abs Immature Granulocytes: 0.04 10*3/uL (ref 0.00–0.07)
Basophils Absolute: 0 10*3/uL (ref 0.0–0.1)
Basophils Relative: 0 %
Eosinophils Absolute: 0 10*3/uL (ref 0.0–0.5)
Eosinophils Relative: 0 %
HCT: 51.5 % (ref 39.0–52.0)
Hemoglobin: 16.7 g/dL (ref 13.0–17.0)
Immature Granulocytes: 0 %
Lymphocytes Relative: 12 %
Lymphs Abs: 1.2 10*3/uL (ref 0.7–4.0)
MCH: 28.6 pg (ref 26.0–34.0)
MCHC: 32.4 g/dL (ref 30.0–36.0)
MCV: 88.2 fL (ref 80.0–100.0)
Monocytes Absolute: 1.2 10*3/uL — ABNORMAL HIGH (ref 0.1–1.0)
Monocytes Relative: 12 %
Neutro Abs: 8 10*3/uL — ABNORMAL HIGH (ref 1.7–7.7)
Neutrophils Relative %: 76 %
Platelets: 239 10*3/uL (ref 150–400)
RBC: 5.84 MIL/uL — ABNORMAL HIGH (ref 4.22–5.81)
RDW: 15 % (ref 11.5–15.5)
WBC: 10.5 10*3/uL (ref 4.0–10.5)
nRBC: 0 % (ref 0.0–0.2)

## 2020-07-31 LAB — GLUCOSE, CAPILLARY
Glucose-Capillary: 95 mg/dL (ref 70–99)
Glucose-Capillary: 93 mg/dL (ref 70–99)
Glucose-Capillary: 86 mg/dL (ref 70–99)

## 2020-07-31 LAB — COMPREHENSIVE METABOLIC PANEL
ALT: 30 U/L (ref 0–44)
AST: 28 U/L (ref 15–41)
Albumin: 4.1 g/dL (ref 3.5–5.0)
Alkaline Phosphatase: 59 U/L (ref 38–126)
Anion gap: 11 (ref 5–15)
BUN: 11 mg/dL (ref 6–20)
CO2: 25 mmol/L (ref 22–32)
Calcium: 9.5 mg/dL (ref 8.9–10.3)
Chloride: 101 mmol/L (ref 98–111)
Creatinine, Ser: 1.24 mg/dL (ref 0.61–1.24)
GFR, Estimated: >60 mL/min (ref >60)
Glucose, Bld: 113 mg/dL — ABNORMAL HIGH (ref 70–99)
Potassium: 4.2 mmol/L (ref 3.5–5.1)
Sodium: 137 mmol/L (ref 135–145)
Total Bilirubin: 0.7 mg/dL (ref 0.3–1.2)
Total Protein: 7.5 g/dL (ref 6.5–8.1)

## 2020-07-31 MED ORDER — PANTOPRAZOLE SODIUM 40 MG PO TBEC: 40.0000 mg | DELAYED_RELEASE_TABLET | Freq: Every day | ORAL | 0 refills | Status: DC

## 2020-07-31 MED ORDER — ACETAMINOPHEN 500 MG PO TABS: 1000.0000 mg | ORAL_TABLET | Freq: Three times a day (TID) | ORAL | 0 refills | Status: AC

## 2020-07-31 MED ORDER — ENOXAPARIN SODIUM 60 MG/0.6ML ~~LOC~~ SOLN: 60.0000 mg | Freq: Two times a day (BID) | SUBCUTANEOUS | 0 refills | Status: DC

## 2020-07-31 MED ORDER — GABAPENTIN 100 MG PO CAPS: 200.0000 mg | ORAL_CAPSULE | Freq: Two times a day (BID) | ORAL | 0 refills | Status: DC

## 2020-07-31 MED ORDER — TRAMADOL HCL 50 MG PO TABS: 50.0000 mg | ORAL_TABLET | Freq: Four times a day (QID) | ORAL | 0 refills | Status: DC | PRN

## 2020-07-31 MED ORDER — ONDANSETRON 4 MG PO TBDP: 4.0000 mg | ORAL_TABLET | Freq: Four times a day (QID) | ORAL | 0 refills | Status: DC | PRN

## 2020-07-31 MED FILL — ENOXAPARIN SODIUM 60 MG/0.6: 60 | 14 days supply | Qty: 17 | Fill #0

## 2020-07-31 MED FILL — GABAPENTIN 100 MG CAPSULE: 100 | 5 days supply | Qty: 20 | Fill #0

## 2020-07-31 MED FILL — traMADol HCL 50 MG TABS: 50 | 2 days supply | Qty: 10 | Fill #0

## 2020-07-31 MED FILL — PANTOPRAZOLE SOD DR 40 MG T: 40 | 30 days supply | Qty: 30 | Fill #0

## 2020-07-31 MED FILL — ONDANSETRON ODT 4 MG TABLET: 4 | 5 days supply | Qty: 20 | Fill #0

## 2020-07-31 NOTE — Progress Notes (Signed)
Nutrition Education Note  Received consult for diet education for patient s/p bariatric surgery. Patient is POD #1 lap gastric sleeve. He has been tolerating sips of water and protein shake.   Discussed 2 week post op diet with pt. Emphasized that liquids must be non carbonated, non caffeinated, and sugar free. Fluid goals discussed. Pt to follow up with outpatient bariatric RD for further diet progression after 2 weeks. Multivitamins and minerals also reviewed. He reported remembering these topics from recent appointment with outpatient RD and that he has already purchased his multivitamin and minerals and protein shakes for at home.   Teach back method used, pt expressed understanding, expect good compliance.  If nutrition issues arise, please consult RD.     Trenton Gammon, MS, RD, LDN, CNSC Inpatient Clinical Dietitian RD pager # available in [facility]  After hours/weekend pager # available in [facility]

## 2020-07-31 NOTE — Progress Notes (Signed)
Pt has been out of bed and in chair for most of the day. Recently ambulated with family in hall with no complaints.

## 2020-07-31 NOTE — Discharge Instructions (Signed)
GASTRIC BYPASS / SLEEVE  Home Care Instructions  These instructions are to help you care for yourself when you go home.  Call: If you have any problems. . Call [phone] and ask for the surgeon on call . If you have an emergency related to your surgery please use the ER at [facility].  . Tell the ER staff that you are a new post-op gastric bypass or gastric sleeve patient   Signs and symptoms to report: . Severe vomiting or nausea o If you cannot handle clear liquids for longer than 1 day, call your surgeon  . Abdominal pain which does not get better after taking your pain medication . Fever greater than 100.4 F and chills . Heart rate over 100 beats a minute . Trouble breathing . Chest pain .  Redness, swelling, drainage, or foul odor at incision (surgical) sites .  If your incisions open or pull apart . Swelling or pain in calf (lower leg) . Diarrhea (Loose bowel movements that happen often), frequent watery, uncontrolled bowel movements . Constipation, (no bowel movements for 3 days) if this happens:  o Take Milk of Magnesia, 2 tablespoons by mouth, 3 times a day for 2 days if needed o Stop taking Milk of Magnesia once you have had a bowel movement o Call your doctor if constipation continues Or o Take Miralax  (instead of Milk of Magnesia) following the label instructions o Stop taking Miralax once you have had a bowel movement o Call your doctor if constipation continues . Anything you think is "abnormal for you"   Normal side effects after surgery: . Unable to sleep at night or unable to concentrate . Irritability . Being tearful (crying) or depressed These are common complaints, possibly related to your anesthesia, stress of surgery and change in lifestyle, that usually go away a few weeks after surgery.  If these feelings continue, call your medical doctor.  Wound Care: You may have surgical glue, steri-strips, or staples over your incisions after surgery . Surgical  glue:  Looks like a clear film over your incisions and will wear off a little at a time . Steri-strips : Adhesive strips of tape over your incisions. You may notice a yellowish color on the skin under the steri-strips. This is used to make the   steri-strips stick better. Do not pull the steri-strips off - let them fall off . Staples: Staples may be removed before you leave the hospital o If you go home with staples, call [facility] at for an appointment with your surgeon's nurse to have staples removed 10 days after surgery, [phone] . Showering: You may shower two (2) days after your surgery unless your surgeon tells you differently o Wash gently around incisions with warm soapy water, rinse well, and gently pat dry  o If you have a drain (tube from your incision), you may need someone to hold this while you shower  o No tub baths until staples are removed and incisions are healed     Medications: . Medications should be liquid or crushed if larger than the size of a dime . Extended release pills (medication that releases a little bit at a time through the day) should not be crushed . Depending on the size and number of medications you take, you may need to space (take a few throughout the day)/change the time you take your medications so that you do not over-fill your pouch (smaller stomach) . Make sure you follow-up with   your primary care physician to make medication changes needed during rapid weight loss and life-style changes . If you have diabetes, follow up with the doctor that orders your diabetes medication(s) within one week after surgery and check your blood sugar regularly. . Do not drive while taking narcotics (pain medications) . DO NOT take NSAID'S (Examples of NSAID's include ibuprofen, naproxen)  Diet:                    First 2 Weeks  You will see the nutritionist about two (2) weeks after your surgery. The nutritionist will increase the types of foods you can  eat if you are handling liquids well: . If you have severe vomiting or nausea and cannot handle clear liquids lasting longer than 1 day, call your surgeon  Protein Shake . Drink at least 2 ounces of shake 5-6 times per day . Each serving of protein shakes (usually 8 - 12 ounces) should have a minimum of:  o 15 grams of protein  o And no more than 5 grams of carbohydrate  . Goal for protein each day: o Men = 80 grams per day o Women = 60 grams per day . Protein powder may be added to fluids such as non-fat milk or Lactaid milk or Soy milk (limit to 35 grams added protein powder per serving)  Hydration . Slowly increase the amount of water and other clear liquids as tolerated (See Acceptable Fluids) . Slowly increase the amount of protein shake as tolerated  .  Sip fluids slowly and throughout the day . May use sugar substitutes in small amounts (no more than 6 - 8 packets per day; i.e. Splenda)  Fluid Goal . The first goal is to drink at least 8 ounces of protein shake/drink per day (or as directed by the nutritionist);  See handout from pre-op Bariatric Education Class for examples of protein shake/drink.   o Slowly increase the amount of protein shake you drink as tolerated o You may find it easier to slowly sip shakes throughout the day o It is important to get your proteins in first . Your fluid goal is to drink 64 - 100 ounces of fluid daily o It may take a few weeks to build up to this . 32 oz (or more) should be clear liquids  And  . 32 oz (or more) should be full liquids (see below for examples) . Liquids should not contain sugar, caffeine, or carbonation  Clear Liquids: . Water or Sugar-free flavored water (i.e. Fruit H2O, Propel) . Decaffeinated coffee or tea (sugar-free) . Lawrence Dalton Lite, Wyler's Lite, Minute Maid Lite . Sugar-free Jell-O . Bouillon or broth . Sugar-free Popsicle:   *Less than 20 calories each; Limit 1 per day  Full Liquids: Protein Shakes/Drinks + 2  choices per day of other full liquids . Full liquids must be: o No More Than 12 grams of Carbs per serving  o No More Than 3 grams of Fat per serving . Strained low-fat cream soup . Non-Fat milk . Fat-free Lactaid Milk . Sugar-free yogurt (Dannon Lite & Fit, Greek yogurt)      Vitamins and Minerals . Start 1 day after surgery unless otherwise directed by your surgeon . Bariatric Specific Complete Multivitamins . Chewable Calcium Citrate with Vitamin D-3 (Example: 3 Chewable Calcium Plus 600 with Vitamin D-3) o Take 500 mg three (3) times a day for a total of 1500 mg each day o Do not take all 3 doses   of calcium at one time as it may cause constipation, and you can only absorb 500 mg  at a time  o Do not mix multivitamins containing iron with calcium supplements; take 2 hours apart  . Menstruating women and those at risk for anemia (a blood disease that causes weakness) may need extra iron o Talk with your doctor to see if you need more iron . If you need extra iron: Total daily Iron recommendation (including Vitamins) is 50 to 100 mg Iron/day . Do not stop taking or change any vitamins or minerals until you talk to your nutritionist or surgeon . Your nutritionist and/or surgeon must approve all vitamin and mineral supplements   Activity and Exercise: It is important to continue walking at home.  Limit your physical activity as instructed by your doctor.  During this time, use these guidelines: . Do not lift anything greater than ten (10) pounds for at least two (2) weeks . Do not go back to work or drive until your surgeon says you can . You may have sex when you feel comfortable  o It is VERY important for male patients to use a reliable birth control method; fertility often increases after surgery  o Do not get pregnant for at least 18 months . Start exercising as soon as your doctor tells you that you can o Make sure your doctor approves any physical activity . Start with a  simple walking program . Walk 5-15 minutes each day, 7 days per week.  . Slowly increase until you are walking 30-45 minutes per day Consider joining our BELT program. [phone] or email [email or web address]   Special Instructions Things to remember:  . Use your CPAP when sleeping if this applies to you, do not stop the use of CPAP unless directed by physician after a sleep study . [facility] has a free Bariatric Surgery Support Group that meets monthly, the 3rd Thursday, 6 pm.  Please review discharge information for date and location of this meeting. . It is very important to keep all follow up appointments with your surgeon, nutritionist, primary care physician, and behavioral health practitioner o After the first year, please follow up with your bariatric surgeon and nutritionist at least once a year in order to maintain best weight loss results   [facility]: [phone] [facility]: [phone] Bariatric Nurse Coordinator: [phone]      

## 2020-07-31 NOTE — Progress Notes (Signed)
1 Day Post-Op   Subjective/Chief Complaint: Min pain Some nausea yesterday A little burping Has completed water On 2nd small cup protein   Objective: Vital signs in last 24 hours: Temp:  [97.7 F (36.5 C)-98.9 F (37.2 C)] 97.7 F (36.5 C) (02/08 0513) Pulse Rate:  [94-111] 104 (02/08 0513) Resp:  [9-23] 18 (02/08 0513) BP: (146-193)/(74-125) 162/93 (02/08 0513) SpO2:  [98 %-100 %] 100 % (02/08 0513) Weight:  [167.8 kg] 167.8 kg (02/07 1351) Last BM Date: 07/29/20  Intake/Output from previous day: 02/07 0701 - 02/08 0700 In: 2430 [P.O.:630; I.V.:1700; IV Piggyback:100] Out: 50 [Blood:50] Intake/Output this shift: No intake/output data recorded.  Alert, nontoxic Sitting in chair nonlabored Soft, obese, min TTP, incisions ok No edema  Lab Results:  Recent Labs    07/30/20 1041 07/31/20 0447  WBC  --  10.5  HGB 16.9 16.7  HCT 52.3* 51.5  PLT  --  239   BMET Recent Labs    07/31/20 0447  NA 137  K 4.2  CL 101  CO2 25  GLUCOSE 113*  BUN 11  CREATININE 1.24  CALCIUM 9.5   PT/INR No results for input(s): LABPROT, INR in the last 72 hours. ABG No results for input(s): PHART, HCO3 in the last 72 hours.  Invalid input(s): PCO2, PO2  Studies/Results: No results found.  Anti-infectives: Anti-infectives (From admission, onward)   Start     Dose/Rate Route Frequency Ordered Stop   07/30/20 0600  cefoTEtan (CEFOTAN) 2 g in sodium chloride 0.9 % 100 mL IVPB        2 g 200 mL/hr over 30 Minutes Intravenous On call to O.R. 07/30/20 0517 07/30/20 0734      Assessment/Plan: s/p Procedure(s): LAPAROSCOPIC GASTRIC SLEEVE RESECTION (N/A) UPPER GI ENDOSCOPY (N/A)  Doing well Some mild tachy hr 110, but preop HR 90, no fever. No wbc.  Tolerating liquids Will monitor vitals this am Met criteria for extended lovenox for vte prevention Plan dc later today Start dc teaching  LOS: 1 day    Greer Pickerel 07/31/2020

## 2020-07-31 NOTE — Progress Notes (Signed)
Patient alert and oriented, Post op day 1.  Provided support and encouragement.  Encouraged pulmonary toilet, ambulation and small sips of liquids.  Patient to watch Lovenox video and given Lovenox education kit.  All questions answered.  Will continue to monitor.

## 2020-07-31 NOTE — Discharge Summary (Signed)
Physician Discharge Summary  Trammell Bowden MHD:[id number] DOB: 05/05/1971 DOA: 07/30/2020  PCP: Lujean Amel, MD  Admit date: 07/30/2020 Discharge date:   Recommendations for Outpatient Follow-up:    Follow-up Information    Greer Pickerel, MD. Go on 08/22/2020.   Specialty: General Surgery Why: at 9:30am. Please arrive 15 minutes prior to your appointment time. Contact information: [address] [place] [address] [phone]        Carlena Hurl, PA-C. Go on 09/18/2020.   Specialty: General Surgery Why: at 2:15pm.  Please arrive 15 minutes prior to your appointment time. Contact information: [place] [address] [phone]              Discharge Diagnoses:  Principal Problem:   Severe obesity ([place]) Active Problems:   OSA on CPAP   Prediabetes   Chronic low back pain with right-sided sciatica   Pure hypercholesterolemia   S/P laparoscopic sleeve gastrectomy   Surgical Procedure: Laparoscopic Sleeve Gastrectomy, upper endoscopy  Discharge Condition: Good Disposition: Home  Diet recommendation: Postoperative sleeve gastrectomy diet (liquids only)  Filed Weights   07/30/20 1351  Weight: (!) 167.8 kg     Hospital Course:  The patient was admitted for a planned laparoscopic sleeve gastrectomy. Please see operative note. Preoperatively the patient was given 5000 units of subcutaneous heparin for DVT prophylaxis. Postoperative prophylactic Lovenox dosing was started on the evening of postoperative day 0. ERAS protocol was used. On the evening of postoperative day 0, the patient was started on water and ice chips. On postoperative day 1 the patient had no fever or tachycardia and was tolerating water in their diet was gradually advanced throughout the day. The patient was ambulating without difficulty. Their vital signs are stable without fever or tachycardia. Their hemoglobin had remained stable. The patient was maintained on  their home settings for CPAP therapy. The patient had received discharge instructions and counseling. They were deemed stable for discharge and had met discharge criteria   Discharge Instructions  Discharge Instructions    Ambulate hourly while awake   Complete by: As directed    Call MD for:  difficulty breathing, headache or visual disturbances   Complete by: As directed    Call MD for:  persistant dizziness or light-headedness   Complete by: As directed    Call MD for:  persistant nausea and vomiting   Complete by: As directed    Call MD for:  redness, tenderness, or signs of infection (pain, swelling, redness, odor or green/yellow discharge around incision site)   Complete by: As directed    Call MD for:  severe uncontrolled pain   Complete by: As directed    Call MD for:  temperature >101 F   Complete by: As directed    Diet bariatric full liquid   Complete by: As directed    Discharge instructions   Complete by: As directed    See bariatric discharge instructions   Incentive spirometry   Complete by: As directed    Perform hourly while awake     Allergies as of 07/31/2020      Reactions   Other    Blood refusal      Medication List    STOP taking these medications   ibuprofen 200 MG tablet Commonly known as: ADVIL     TAKE these medications   acetaminophen 500 MG tablet Commonly known as: TYLENOL Take 2 tablets (1,000 mg total) by mouth every 8 (  eight) hours for 5 days.   cetirizine-pseudoephedrine 5-120 MG tablet Commonly known as: ZYRTEC-D Take 1 tablet by mouth daily as needed (sinus issues.).   enoxaparin 60 MG/0.6ML injection Commonly known as: LOVENOX Inject 0.6 mLs (60 mg total) into the skin every 12 (twelve) hours for 14 days.   gabapentin 100 MG capsule Commonly known as: NEURONTIN Take 2 capsules (200 mg total) by mouth every 12 (twelve) hours.   multivitamin with minerals Tabs tablet Take 1 tablet by mouth daily.   ondansetron 4 MG  disintegrating tablet Commonly known as: ZOFRAN-ODT Take 1 tablet (4 mg total) by mouth every 6 (six) hours as needed for nausea or vomiting.   pantoprazole 40 MG tablet Commonly known as: PROTONIX Take 1 tablet (40 mg total) by mouth daily.   testosterone cypionate 100 MG/ML injection Commonly known as: DEPOTESTOTERONE CYPIONATE Inject 125 mg into the muscle every Friday. For IM use only   traMADol 50 MG tablet Commonly known as: ULTRAM Take 1 tablet (50 mg total) by mouth every 6 (six) hours as needed (pain).       Follow-up Information    Greer Pickerel, MD. Go on 08/22/2020.   Specialty: General Surgery Why: at 9:30am. Please arrive 15 minutes prior to your appointment time. Contact information: [address] [place] [address] [phone]        Carlena Hurl, PA-C. Go on 09/18/2020.   Specialty: General Surgery Why: at 2:15pm.  Please arrive 15 minutes prior to your appointment time. Contact information: [address] [place] [address] [phone]                The results of significant diagnostics from this hospitalization (including imaging, microbiology, ancillary and laboratory) are listed below for reference.    Significant Diagnostic Studies: No results found.  Labs: Basic Metabolic Panel: Recent Labs  Lab 07/25/20 1012 07/31/20 0447  NA 136 137  K 4.5 4.2  CL 103 101  CO2 25 25  GLUCOSE 89 113*  BUN 13 11  CREATININE 1.13 1.24  CALCIUM 8.9 9.5   Liver Function Tests: Recent Labs  Lab 07/25/20 1012 07/31/20 0447  AST 23 28  ALT 31 30  ALKPHOS 57 59  BILITOT 0.6 0.7  PROT 7.5 7.5  ALBUMIN 3.9 4.1    CBC: Recent Labs  Lab 07/25/20 1012 07/30/20 1041 07/31/20 0447  WBC 4.2  --  10.5  NEUTROABS 2.2  --  8.0*  HGB 16.6 16.9 16.7  HCT 51.6 52.3* 51.5  MCV 87.8  --  88.2  PLT 246  --  239    CBG: Recent Labs  Lab 07/30/20 2005 07/30/20 2348 07/31/20 0401 07/31/20 0728 07/31/20 1122   GLUCAP 127* 129* 95 93 86    Principal Problem:   Severe obesity ([place]) Active Problems:   OSA on CPAP   Prediabetes   Chronic low back pain with right-sided sciatica   Pure hypercholesterolemia   S/P laparoscopic sleeve gastrectomy   Time coordinating discharge: 15 min  Signed:  Gayland Curry, MD [facility], [place] [phone] 07/31/2020, 12:53 PM

## 2020-07-31 NOTE — Progress Notes (Addendum)
Patient alert and oriented, pain is controlled. Patient is tolerating fluids, advanced to protein shake today, patient is tolerating well.  Reviewed Gastric sleeve discharge instructions with patient and patient is able to articulate understanding.  Provided information on BELT program, Support Group and [facility] outpatient pharmacy. Pt to pick up Lovenox from [facility] pharmacy. All questions answered, will continue to monitor.  Total 24hr fluid recall: Per dehydration protocol, will call pt to f/u within one week post op

## 2020-07-31 NOTE — Progress Notes (Signed)
Patient has finished water and protein this am. He is up walking and voiding independently. Vitals stable. Incentive spirometer demonstrated. Only complains of gas that is relieved by prn.

## 2020-08-07 ENCOUNTER — Telehealth (HOSPITAL_COMMUNITY): Payer: Self-pay | Admitting: *Deleted

## 2020-08-07 NOTE — Telephone Encounter (Signed)
1.  Tell me about your pain and pain management? Pt denies any pain.   2.  Let's talk about fluid intake.  How much total fluid are you taking in? Pt states that he is getting in at least 80-100oz of fluid including protein shakes, bottled water, soups and jello.   3.  How much protein have you taken in the last 2 days? Pt states he is meeting his goal of 80g of protein with the MRE 40g protein shakes.   4.  Have you had nausea?  Tell me about when have experienced nausea and what you did to help? Pt denies nausea.   5.  Has the frequency or color changed with your urine? Pt states that he is urinating "fine" with no changes in frequency or urgency.     6.  Tell me what your incisions look like? "Incisions look good" with the exception of 1 that really itches.  Pt encouraged not to scratch it.  Pt denies a fever, chills.  Pt states that incisions are not swollen, open, or draining.  Pt encouraged to call [facility] for any changes.   7.  Have you been passing gas? BM? Pt states that he is having BMs. Last BM 08/07/20.     8.  If a problem or question were to arise who would you call?  Do you know contact numbers for [facility], [facility], and [facility]? Pt can describe s/sx of dehydration.  Pt knows to call [facility] for surgical, [facility] for nutrition, and [facility] for non-urgent questions or concerns.   9.  How has the walking going? Pt states he is walking around and able to be active without difficulty.  Pt was able to walk dogs outside with wife to the "top of the block and back".   10.  How are your vitamins and calcium going?  How are you taking them? Pt states that he is taking his supplements and vitamins without difficulty.

## 2020-08-14 ENCOUNTER — Other Ambulatory Visit: Payer: Self-pay

## 2020-08-14 ENCOUNTER — Encounter: Payer: BC Managed Care – PPO | Attending: General Surgery | Admitting: Skilled Nursing Facility1

## 2020-08-14 NOTE — Progress Notes (Signed)
2 Week Post-Operative Nutrition Class   Patient was seen on 08/17/18 for Post-Operative Nutrition education at the [facility].    Surgery date: 07/30/2020 Surgery type: sleeve Start weight at [facility]: 367.7 Weight today:pt declined     The following the learning objectives were met by the patient during this course:  Identifies Phase 3 (Soft, High Proteins) Dietary Goals and will begin from 2 weeks post-operatively to 2 months post-operatively  Identifies appropriate sources of fluids and proteins   Identifies appropriate fat sources and healthy verses unhealthy fat types    States protein recommendations and appropriate sources post-operatively  Identifies the need for appropriate texture modifications, mastication, and bite sizes when consuming solids  Identifies appropriate multivitamin and calcium sources post-operatively  Describes the need for physical activity post-operatively and will follow MD recommendations  States when to call healthcare provider regarding medication questions or post-operative complications   Handouts given during class include:  Phase 3A: Soft, High Protein Diet Handout  Phase 3 High Protein Meals  Healthy Fats   Follow-Up Plan: Patient will follow-up at [facility] in 6 weeks for 2 month post-op nutrition visit for diet advancement per MD.

## 2020-08-20 ENCOUNTER — Telehealth: Payer: Self-pay | Admitting: Skilled Nursing Facility1

## 2020-08-20 NOTE — Telephone Encounter (Signed)
RD called pt to verify fluid intake once starting soft, solid proteins 2 week post-bariatric surgery.   Daily Fluid intake: 64 oz Daily Protein intake: 80g  Concerns/issues:   None stated

## 2020-08-22 DIAGNOSIS — Z5181 Encounter for therapeutic drug level monitoring: Secondary | ICD-10-CM | POA: Diagnosis not present

## 2020-09-25 ENCOUNTER — Other Ambulatory Visit: Payer: Self-pay

## 2020-09-25 ENCOUNTER — Encounter: Payer: BC Managed Care – PPO | Attending: General Surgery | Admitting: Skilled Nursing Facility1

## 2020-09-25 DIAGNOSIS — E669 Obesity, unspecified: Secondary | ICD-10-CM | POA: Diagnosis not present

## 2020-09-25 NOTE — Progress Notes (Signed)
Bariatric Nutrition Follow-Up Visit Medical Nutrition Therapy   NUTRITION ASSESSMENT    Anthropometrics  Surgery date: 07/30/2020 Surgery type: sleeve Start weight at [facility]: 367.7 Weight today:pt declined  Clinical  Medical hx: prediabetes, back pain, headaches Medications: see list Labs:   Lifestyle & Dietary Hx  Pt states since having the surgery he has not needed any pain medication for back pain or headaches. Pt states he does hello fresh which he enjoys.   Estimated daily fluid intake: 64+ oz Estimated daily protein intake: 80+ g Supplements: multi and calcium  Current average weekly physical activity: will start with trainer tomorrow currently walking around the neighborhood 30 minutes 6 days a  week  24-Hr Dietary Recall First Meal: 3 eggs + 3 [place] bacon Snack:  Second Meal: chicken and shrimp hibachi (typically eaten out) or lunch meat and cheese Snack: greek yogurt Third Meal: leftovers from lunch Snack: yogurt and sugar free jello Beverages: water + flavorings, water  Post-Op Goals/ Signs/ Symptoms Using straws: no Drinking while eating: no Chewing/swallowing difficulties: no Changes in vision: no Changes to mood/headaches: no Hair loss/changes to skin/nails: no Difficulty focusing/concentrating: no Sweating: no Dizziness/lightheadedness: no Palpitations: no  Carbonated/caffeinated beverages: no N/V/D/C/Gas: no Abdominal pain: no Dumping syndrome: no    NUTRITION DIAGNOSIS  Overweight/obesity ([place]-3.3) related to past poor dietary habits and physical inactivity as evidenced by completed bariatric surgery and following dietary guidelines for continued weight loss and healthy nutrition status.   NUTRITION INTERVENTION Nutrition counseling (C-1) and education (E-2) to facilitate bariatric surgery goals, including: . Diet advancement to the next phase (phase 4) now including non starchy  . The importance of consuming adequate calories as well as certain  nutrients daily due to the body's need for essential vitamins, minerals, and fats . The importance of daily physical activity and to reach a goal of at least 150 minutes of moderate to vigorous physical activity weekly (or as directed by their physician) due to benefits such as increased musculature and improved lab values . The importance of intuitive eating specifically learning hunger-satiety cues and understanding the importance of learning a new body: The importance of mindful eating to avoid grazing behaviors   Goals: -Continue to aim for a minimum of 64 fluid ounces 7 days a week with at least 30 ounces being plain water -Eat non-starchy vegetables 2 times a day 7 days a week -Start out with soft cooked vegetables today and tomorrow; if tolerated begin to eat raw vegetables or cooked including salads -Eat your 3 ounces of protein first then start in on your non-starchy vegetables; once you understand how much of your meal leads to satisfaction and not full while still eating 3 ounces of protein and non-starchy vegetables you can eat them in any order  -Continue to aim for 30 minutes of activity at least 5 times a week -Do NOT cook with/add to your food: alfredo sauce, cheese sauce, barbeque sauce, ketchup, fat back, butter, bacon grease, grease, Crisco, OR SUGAR  Handouts Provided Include   Phase 4  Learning Style & Readiness for Change Teaching method utilized: Visual & Auditory  Demonstrated degree of understanding via: Teach Back  Readiness Level: Action Barriers to learning/adherence to lifestyle change: none identified   RD's Notes for Next Visit . Assess adherence to pt chosen goals   MONITORING & EVALUATION Dietary intake, weekly physical activity, body weight  Next Steps Patient is to follow-up in 6 weeks

## 2020-09-28 DIAGNOSIS — E349 Endocrine disorder, unspecified: Secondary | ICD-10-CM | POA: Diagnosis not present

## 2020-09-28 DIAGNOSIS — G4733 Obstructive sleep apnea (adult) (pediatric): Secondary | ICD-10-CM | POA: Diagnosis not present

## 2020-09-28 DIAGNOSIS — Z9989 Dependence on other enabling machines and devices: Secondary | ICD-10-CM | POA: Diagnosis not present

## 2020-11-06 ENCOUNTER — Encounter: Payer: BC Managed Care – PPO | Attending: General Surgery | Admitting: Skilled Nursing Facility1

## 2020-11-06 ENCOUNTER — Other Ambulatory Visit: Payer: Self-pay

## 2020-11-06 DIAGNOSIS — E669 Obesity, unspecified: Secondary | ICD-10-CM | POA: Diagnosis not present

## 2020-11-06 NOTE — Progress Notes (Signed)
Bariatric Nutrition Follow-Up Visit Medical Nutrition Therapy   NUTRITION ASSESSMENT    Anthropometrics  Surgery date: 07/30/2020 Surgery type: sleeve Start weight at [facility]: 367.7 Weight today: 325 pounds, 37.6 body fat %, 43.3% TBW  Clinical  Medical hx: prediabetes, back pain, headaches Medications: see list Labs:   Lifestyle & Dietary Hx   Pt states he no longer needs to take pain relieves due to no longer having headaches or back pains. Pt states he does hello fresh every 2 weeks now instead of every week. Pt states he is losing a lot of inches.   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 week  24-Hr Dietary Recall First Meal: 3 eggs + 3 [place] bacon or kilbasa or english muffin sausage egg from [employer] Snack:  Second Meal: chicken and shrimp hibachi (typically eaten out) or lunch meat and cheese or 2 tacos  Snack: greek yogurt Third Meal: chicken wings Snack: yogurt and sugar free jello Beverages: water + flavorings, water  Post-Op Goals/ Signs/ Symptoms Using straws: no Drinking while eating: no Chewing/swallowing difficulties: no Changes in vision: no Changes to mood/headaches: no Hair loss/changes to skin/nails: no Difficulty focusing/concentrating: no Sweating: no Dizziness/lightheadedness: no Palpitations: no  Carbonated/caffeinated beverages: no N/V/D/C/Gas: no Abdominal pain: no Dumping syndrome: no    NUTRITION DIAGNOSIS  Overweight/obesity ([place]-3.3) related to past poor dietary habits and physical inactivity as evidenced by completed bariatric surgery and following dietary guidelines for continued weight loss and healthy nutrition status.   NUTRITION INTERVENTION Nutrition counseling (C-1) and education (E-2) to facilitate bariatric surgery goals, including: . Diet advancement to the  next phas now including fruit  . The importance of consuming adequate calories as well as certain nutrients daily due to the body's need for essential vitamins, minerals, and fats . The importance of daily physical activity and to reach a goal of at least 150 minutes of moderate to vigorous physical activity weekly (or as directed by their physician) due to benefits such as increased musculature and improved lab values . The importance of intuitive eating specifically learning hunger-satiety cues and understanding the importance of learning a new body: The importance of mindful eating to avoid grazing behaviors   Goals: -Add in 2 servings of fruit per day increase to 3 if needed   Handouts Provided Include   fruit  Learning Style & Readiness for Change Teaching method utilized: Visual & Auditory  Demonstrated degree of understanding via: Teach Back  Readiness Level: Action Barriers to learning/adherence to lifestyle change: none identified   RD's Notes for Next Visit . Assess adherence to pt chosen goals   MONITORING & EVALUATION Dietary intake, weekly physical activity, body weight  Next Steps Patient is to follow-up in 2 months

## 2020-12-10 DIAGNOSIS — H47233 Glaucomatous optic atrophy, bilateral: Secondary | ICD-10-CM | POA: Diagnosis not present

## 2021-01-04 DIAGNOSIS — G4733 Obstructive sleep apnea (adult) (pediatric): Secondary | ICD-10-CM | POA: Diagnosis not present

## 2021-01-04 DIAGNOSIS — Z9884 Bariatric surgery status: Secondary | ICD-10-CM | POA: Diagnosis not present

## 2021-01-04 DIAGNOSIS — R7303 Prediabetes: Secondary | ICD-10-CM | POA: Diagnosis not present

## 2021-01-07 ENCOUNTER — Ambulatory Visit: Payer: BC Managed Care – PPO | Admitting: Skilled Nursing Facility1

## 2021-01-08 ENCOUNTER — Encounter: Payer: BC Managed Care – PPO | Attending: General Surgery | Admitting: Skilled Nursing Facility1

## 2021-01-08 ENCOUNTER — Other Ambulatory Visit: Payer: Self-pay

## 2021-01-08 NOTE — Progress Notes (Signed)
Bariatric Nutrition Follow-Up Visit Medical Nutrition Therapy   NUTRITION ASSESSMENT    Anthropometrics  Surgery date: 07/30/2020 Surgery type: sleeve Start weight at [facility]: 367.7 pounds  Weight today: 330.6 pounds  Body Composition Scale 01/08/2021  Current Body Weight 330.6  Total Body Fat % 38.1  Visceral Fat 33  Fat-Free Mass % 61.8   Total Body Water % 42.8  Muscle-Mass lbs 62.4  BMI 44.9  Body Fat Displacement          Torso  lbs 78.2         Left Leg  lbs 15.6         Right Leg  lbs 15.6         Left Arm  lbs 7.8         Right Arm   lbs 7.8    Clinical  Medical hx: prediabetes, back pain: no longer, headaches: no longer Medications: see list Labs:   Lifestyle & Dietary Hx  Pt states he no longer needs to take pain relieves due to no longer having headaches or back pains.  Pt states he is losing a lot of inches.  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  Pt states he has been eating junk food: cookies and such. Pt states his wife really likes junk food.  Pt returns stating he has added all foods back in. Pt states he noticed when he has fruit in the house he does not snack on goodies.  Pt states he will go to [place] in a couple days.   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 throughout the week  24-Hr Dietary Recall: 2-3 yogurts per day First Meal: english muffin sausage egg and cheddar from [employer] Snack: watermelon Second Meal: pita delight mediterranean chicken in [place] Snack: greek yogurt Third Meal: corn and chicken chowder + salad Snack: yogurt and sugar free jello Beverages: water + flavorings, water  Post-Op Goals/ Signs/ Symptoms Using straws: no Drinking while eating: no Chewing/swallowing difficulties: no Changes in vision: no Changes to  mood/headaches: no Hair loss/changes to skin/nails: no Difficulty focusing/concentrating: no Sweating: no Dizziness/lightheadedness: no Palpitations: no  Carbonated/caffeinated beverages: no N/V/D/C/Gas: no Abdominal pain: no Dumping syndrome: no    NUTRITION DIAGNOSIS  Overweight/obesity ([place]-3.3) related to past poor dietary habits and physical inactivity as evidenced by completed bariatric surgery and following dietary guidelines for continued weight loss and healthy nutrition status.   NUTRITION INTERVENTION Nutrition counseling (C-1) and education (E-2) to facilitate bariatric surgery goals, including: The importance of consuming adequate calories as well as certain nutrients daily due to the body's need for essential vitamins, minerals, and fats The importance of daily physical activity and to reach a goal of at least 150 minutes of moderate to vigorous physical activity weekly (or as directed by their physician) due to benefits such as increased musculature and improved lab values The importance of intuitive eating specifically learning hunger-satiety cues and understanding the importance of learning a new body: The importance of mindful eating to avoid grazing behaviors  Avoidance of grazing behaviors Reading nutrition facts label and sticking to serving sizes   Goals: -snack on non starchy vegetables  -read your label and stick to serving size if it is not enough have non starchy vegetables with them  Handouts Provided Include  Learning Style & Readiness for Change Teaching method utilized: Visual & Auditory  Demonstrated degree of understanding via: Teach Back  Readiness Level: Action Barriers to learning/adherence to lifestyle change: portion sizes/calorically dense foods   RD's Notes for Next Visit Assess adherence to pt chosen goals   MONITORING & EVALUATION Dietary intake, weekly physical activity, body weight  Next Steps Patient is to follow-up in October

## 2021-04-01 ENCOUNTER — Other Ambulatory Visit: Payer: Self-pay

## 2021-04-01 ENCOUNTER — Encounter: Payer: BC Managed Care – PPO | Attending: General Surgery | Admitting: Skilled Nursing Facility1

## 2021-04-01 NOTE — Progress Notes (Signed)
Bariatric Nutrition Follow-Up Visit Medical Nutrition Therapy   NUTRITION ASSESSMENT    Anthropometrics  Surgery date: 07/30/2020 Surgery type: sleeve Start weight at [facility]: 367.7 pounds  Weight today: pt declined   Body Composition Scale 01/08/2021  Current Body Weight 330.6  Total Body Fat % 38.1  Visceral Fat 33  Fat-Free Mass % 61.8   Total Body Water % 42.8  Muscle-Mass lbs 62.4  BMI 44.9  Body Fat Displacement          Torso  lbs 78.2         Left Leg  lbs 15.6         Right Leg  lbs 15.6         Left Arm  lbs 7.8         Right Arm   lbs 7.8    Clinical  Medical hx: prediabetes, back pain: no longer, headaches: no longer Medications: see list Labs:   Lifestyle & Dietary Hx  Pt states he has been working out with his personal trainer feeling stringer and a great energy level. Pt states he has been trying to get more salads in. Pt states he has been doing really good with not snacking as much and feels he has proper control. Pt states he did notice he increased his protein and rice and was not doing as much veggies which his bowel habits suffered: dietitian reinforced this increase of veggie habit.   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 throughout the week (reduced over time but wants to get back into it)  24-Hr Dietary Recall: 2-3 yogurts per day First Meal: 4 eggs + yogurt or starbucks sausage egg english muffin Snack: popcorn or kind bar Second Meal: pita delight mediterranean chicken in pita or meat and veggie Snack: greek yogurt Third Meal: stir fry chicken pasta and veggies Snack: yogurt and sugar free jello Beverages: water + flavorings, water  Post-Op Goals/ Signs/ Symptoms Using straws: no Drinking while eating: no Chewing/swallowing difficulties: no Changes in vision: no Changes to  mood/headaches: no Hair loss/changes to skin/nails: no Difficulty focusing/concentrating: no Sweating: no Dizziness/lightheadedness: no Palpitations: no  Carbonated/caffeinated beverages: no N/V/D/C/Gas: no Abdominal pain: no Dumping syndrome: no    NUTRITION DIAGNOSIS  Overweight/obesity ([place]-3.3) related to past poor dietary habits and physical inactivity as evidenced by completed bariatric surgery and following dietary guidelines for continued weight loss and healthy nutrition status.   NUTRITION INTERVENTION Nutrition counseling (C-1) and education (E-2) to facilitate bariatric surgery goals, including: The importance of consuming adequate calories as well as certain nutrients daily due to the body's need for essential vitamins, minerals, and fats The importance of daily physical activity and to reach a goal of at least 150 minutes of moderate to vigorous physical activity weekly (or as directed by their physician) due to benefits such as increased musculature and improved lab values The importance of intuitive eating specifically learning hunger-satiety cues and understanding the importance of learning a new body: The importance of mindful eating to avoid grazing behaviors  Avoidance of grazing behaviors Reading nutrition facts label and sticking to serving sizes   Goals:  -reduce to 3 eggs and keep yogurt -great job on recognizing healthy behaviors you fell out of and got right back to them!  Handouts Provided Include    Learning Style & Readiness for Change Teaching  method utilized: Special educational needs teacher  Demonstrated degree of understanding via: Teach Back  Readiness Level: Action Barriers to learning/adherence to lifestyle change: portion sizes/calorically dense foods   RD's Notes for Next Visit Assess adherence to pt chosen goals   MONITORING & EVALUATION Dietary intake, weekly physical activity, body weight  Next Steps Patient is to follow-up in October

## 2021-06-25 DIAGNOSIS — Z125 Encounter for screening for malignant neoplasm of prostate: Secondary | ICD-10-CM | POA: Diagnosis not present

## 2021-06-25 DIAGNOSIS — E291 Testicular hypofunction: Secondary | ICD-10-CM | POA: Diagnosis not present

## 2021-07-01 DIAGNOSIS — E291 Testicular hypofunction: Secondary | ICD-10-CM | POA: Diagnosis not present

## 2021-07-30 ENCOUNTER — Other Ambulatory Visit: Payer: Self-pay

## 2021-07-30 ENCOUNTER — Encounter: Payer: BC Managed Care – PPO | Attending: General Surgery | Admitting: Skilled Nursing Facility1

## 2021-07-30 DIAGNOSIS — E669 Obesity, unspecified: Secondary | ICD-10-CM | POA: Insufficient documentation

## 2021-07-30 NOTE — Progress Notes (Signed)
Bariatric Nutrition Follow-Up Visit Medical Nutrition Therapy   NUTRITION ASSESSMENT    Anthropometrics  Surgery date: 07/30/2020 Surgery type: sleeve Start weight at [facility]: 367.7 pounds  Weight today: pt declined   Body Composition Scale 01/08/2021  Current Body Weight 330.6  Total Body Fat % 38.1  Visceral Fat 33  Fat-Free Mass % 61.8   Total Body Water % 42.8  Muscle-Mass lbs 62.4  BMI 44.9  Body Fat Displacement          Torso  lbs 78.2         Left Leg  lbs 15.6         Right Leg  lbs 15.6         Left Arm  lbs 7.8         Right Arm   lbs 7.8    Clinical  Medical hx: prediabetes, back pain: no longer, headaches: no longer Medications: see list Labs:   Lifestyle & Dietary Hx  Pt states he did have a bit of a bad habit with cookies during the holidays but is back at avoiding them. Pt states he knows he has to get back to more cardio as he has been focusing on weight lifting.  Pt states he did try the quest cookies: Dietitian education on not eating too many of these eithers. Pt states he does not eat as many vegetables as he needs to.   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 throughout the week (reduced over time but wants to get back into it)  24-Hr Dietary Recall: 2-3 yogurts per day First Meal: 3 eggs + 2 oatmeal + oat milk Snack: greek yogurt Second Meal: salad from a restaurant Snack: greek yogurt + grapes Third Meal: stir fry chicken pasta and veggies Snack: yogurt and sugar free jello + grapes Beverages: water + flavorings, water  Post-Op Goals/ Signs/ Symptoms Using straws: no Drinking while eating: no Chewing/swallowing difficulties: no Changes in vision: no Changes to mood/headaches: no Hair loss/changes to skin/nails: no Difficulty focusing/concentrating: no Sweating:  no Dizziness/lightheadedness: no Palpitations: no  Carbonated/caffeinated beverages: no N/V/D/C/Gas: milk of magnesia 2 times a week Abdominal pain: no Dumping syndrome: no    NUTRITION DIAGNOSIS  Overweight/obesity ([place]-3.3) related to past poor dietary habits and physical inactivity as evidenced by completed bariatric surgery and following dietary guidelines for continued weight loss and healthy nutrition status.   NUTRITION INTERVENTION Nutrition counseling (C-1) and education (E-2) to facilitate bariatric surgery goals, including: The importance of consuming adequate calories as well as certain nutrients daily due to the body's need for essential vitamins, minerals, and fats The importance of daily physical activity and to reach a goal of at least 150 minutes of moderate to vigorous physical activity weekly (or as directed by their physician) due to benefits such as increased musculature and improved lab values The importance of intuitive eating specifically learning hunger-satiety cues and understanding the importance of learning a new body: The importance of mindful eating to avoid grazing behaviors  Avoidance of grazing behaviors Reading nutrition facts label and sticking to serving sizes  Encouraged patient to honor their body's internal hunger and fullness cues.  Throughout the day, check in mentally and rate hunger. Stop eating when satisfied not full regardless of how much food is left on the plate.  Get more if still hungry 20-30  minutes later.  The key is to honor satisfaction so throughout the meal, rate fullness factor and stop when comfortably satisfied not physically full. The key is to honor hunger and fullness without any feelings of guilt or shame.  Pay attention to what the internal cues are, rather than any external factors. This will enhance the confidence you have in listening to your own body and following those internal cues enabling you to increase how often you eat when  you are hungry not out of appetite and stop when you are satisfied not full.  Encouraged pt to continue to eat balanced meals inclusive of non starchy vegetables 2 times a day 7 days a week Encouraged pt to choose lean protein sources: limiting beef, pork, sausage, hotdogs, and lunch meat Encourage pt to choose healthy fats such as plant based limiting animal fats Encouraged pt to continue to drink a minium 64 fluid ounces with half being plain water to satisfy proper hydration   Goals: -great job on recognizing healthy behaviors you fell out of and got right back to them! -ensure you are having a variety of non starchy vegetables throughout the week -continue stopping at satisfaction  -if any future questions or issues please e-mail or call  Handouts Provided Include    Learning Style & Readiness for Change Teaching method utilized: Visual & Auditory  Demonstrated degree of understanding via: Teach Back  Readiness Level: Action Barriers to learning/adherence to lifestyle change:none identified   RD's Notes for Next Visit Assess adherence to pt chosen goals   MONITORING & EVALUATION Dietary intake, weekly physical activity, body weight  Next Steps Patient is to follow-up as needed

## 2021-10-07 DIAGNOSIS — Z6841 Body Mass Index (BMI) 40.0 and over, adult: Secondary | ICD-10-CM | POA: Diagnosis not present

## 2021-10-07 DIAGNOSIS — G4733 Obstructive sleep apnea (adult) (pediatric): Secondary | ICD-10-CM | POA: Diagnosis not present

## 2021-10-07 DIAGNOSIS — Z9989 Dependence on other enabling machines and devices: Secondary | ICD-10-CM | POA: Diagnosis not present

## 2021-10-07 DIAGNOSIS — E349 Endocrine disorder, unspecified: Secondary | ICD-10-CM | POA: Diagnosis not present

## 2021-11-13 IMAGING — DX DG CHEST 2V
2 series · 2 of 2 positions shown · non-contrast
Comparison: None.

CLINICAL DATA: Preoperative bariatric surgery for obesity

EXAM:
CHEST - 2 VIEW

[chest pa]
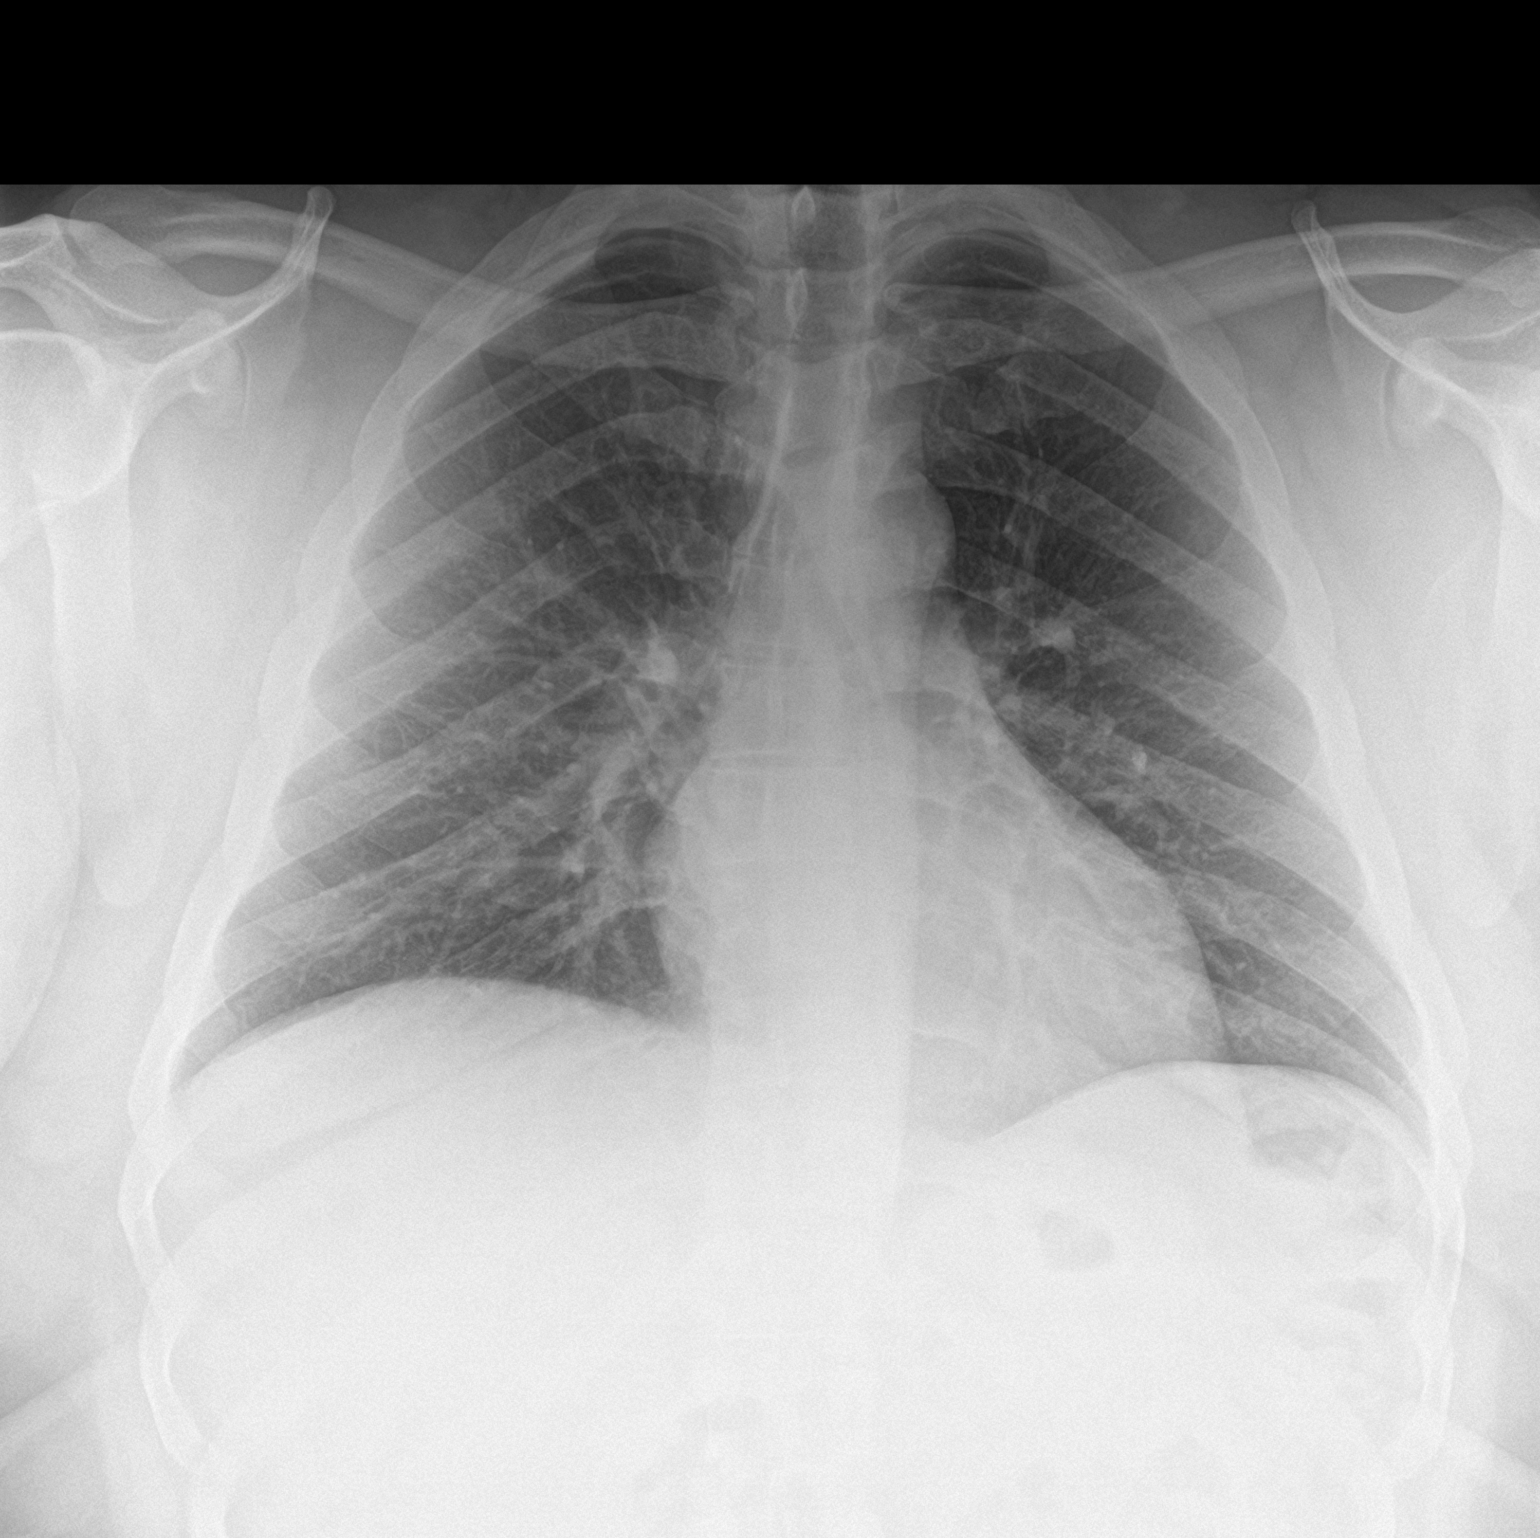

[chest lat]
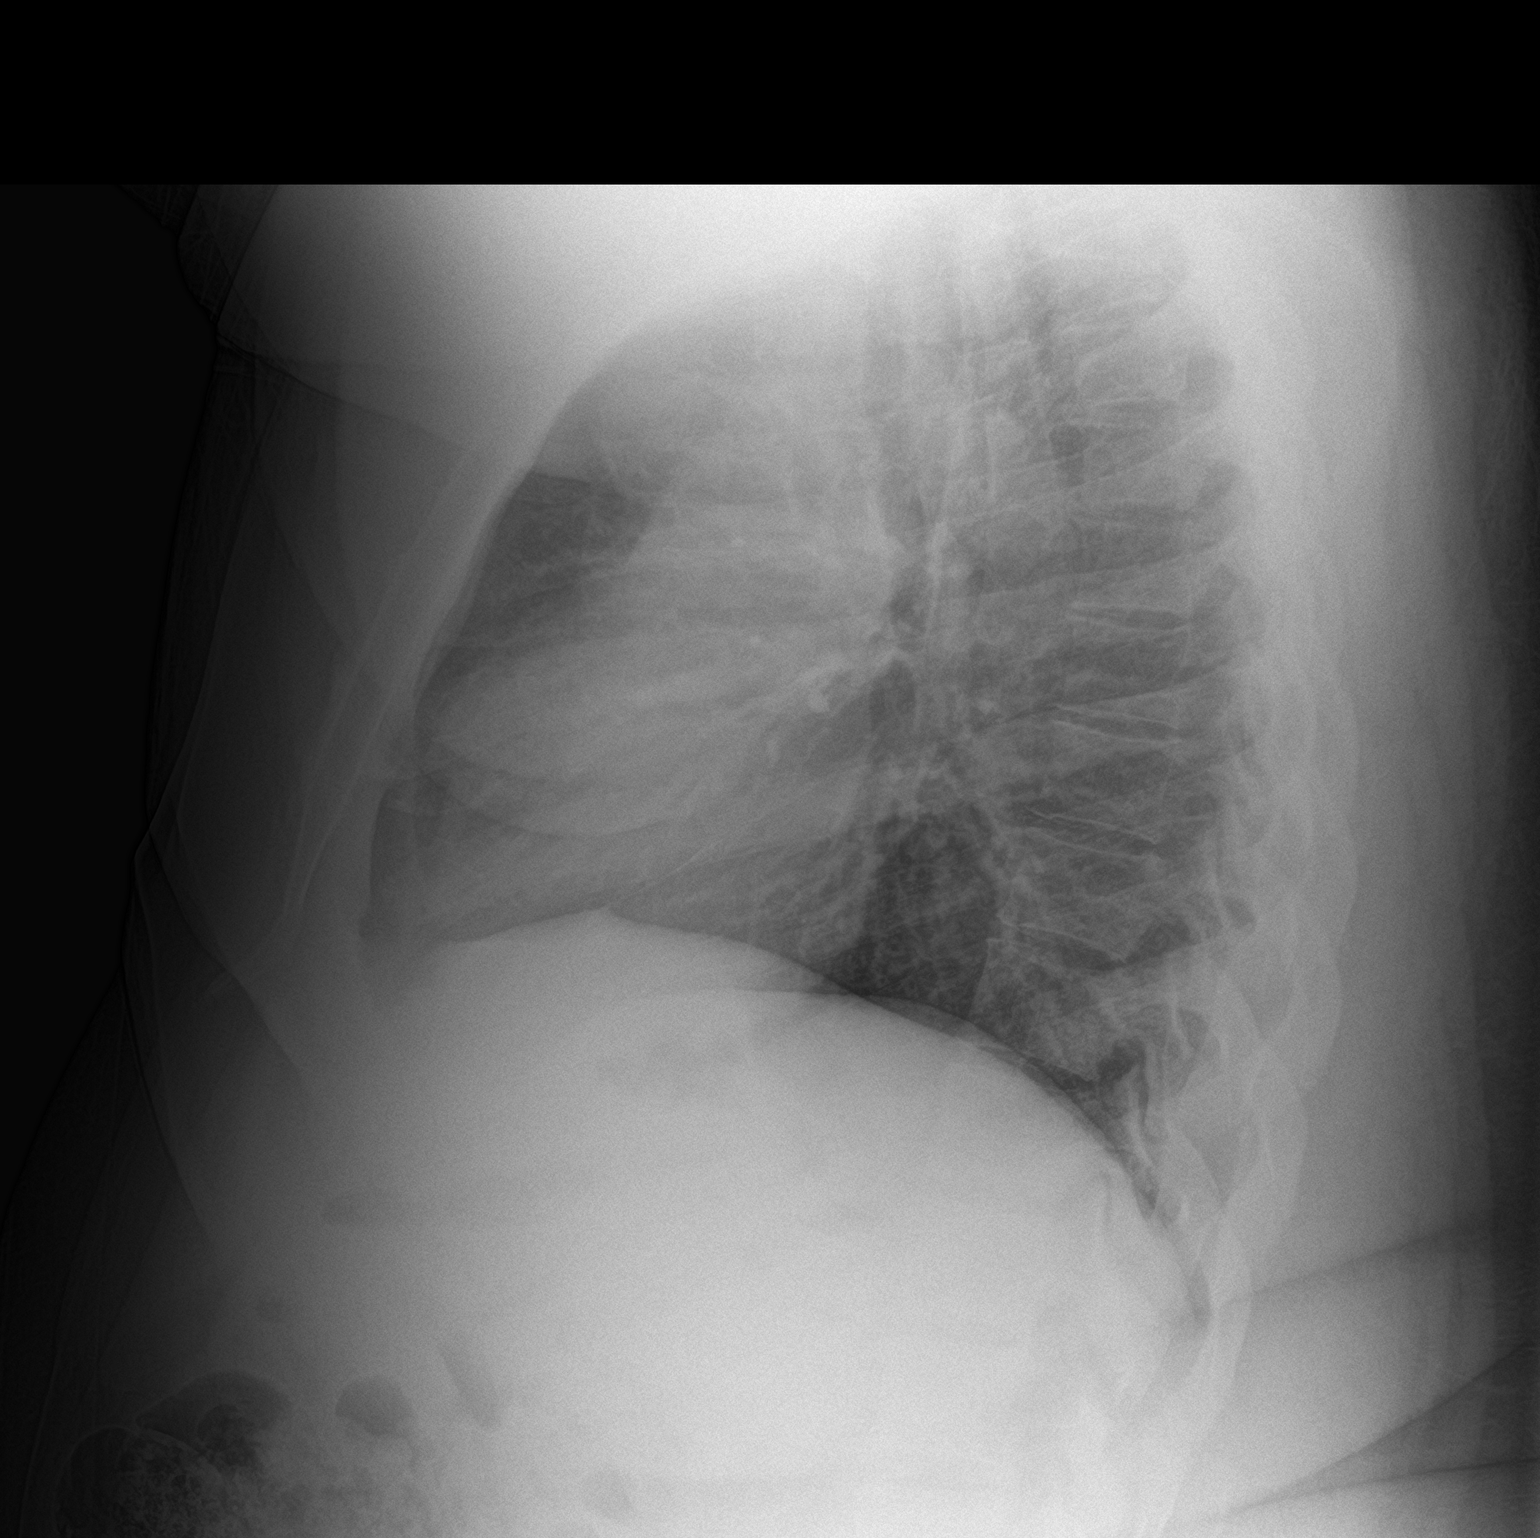

[2 of 2 positions shown; findings below may reference images not displayed]

FINDINGS: Lungs are clear. Heart size and pulmonary vascularity are normal. No
adenopathy. No bone lesions.
IMPRESSION: Lungs clear.  Cardiac silhouette normal.

## 2021-11-13 IMAGING — RF DG UGI W SINGLE CM
7 of 13 series · 7 of 24 positions shown · non-contrast
Comparison: None.

CLINICAL DATA: Preoperative evaluation for sleeve gastrectomy.

EXAM:
UPPER GI SERIES WITH KUB
TECHNIQUE: After obtaining a scout radiograph a routine upper GI series was
performed using thin and high density barium.
FLUOROSCOPY TIME:  Fluoroscopy Time:  1 minute and 18 seconds.
Radiation Exposure Index (if provided by the fluoroscopic device):
324 mGy
Number of Acquired Spot Images: 0

[Series 1: t abdomen supine · 0.15mm/px · 1 of 1 slices shown]
[im 1/1]
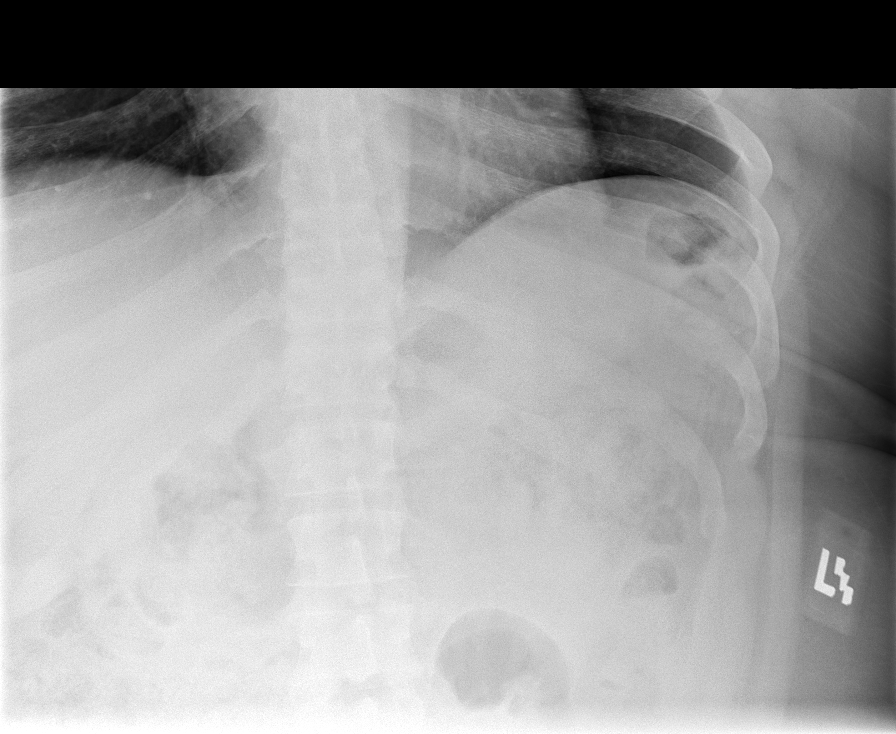

[Series 2: fluoro_barium singleshot_bw · 0.17mm/px · 1 of 1 slices shown]
[im 1/1]
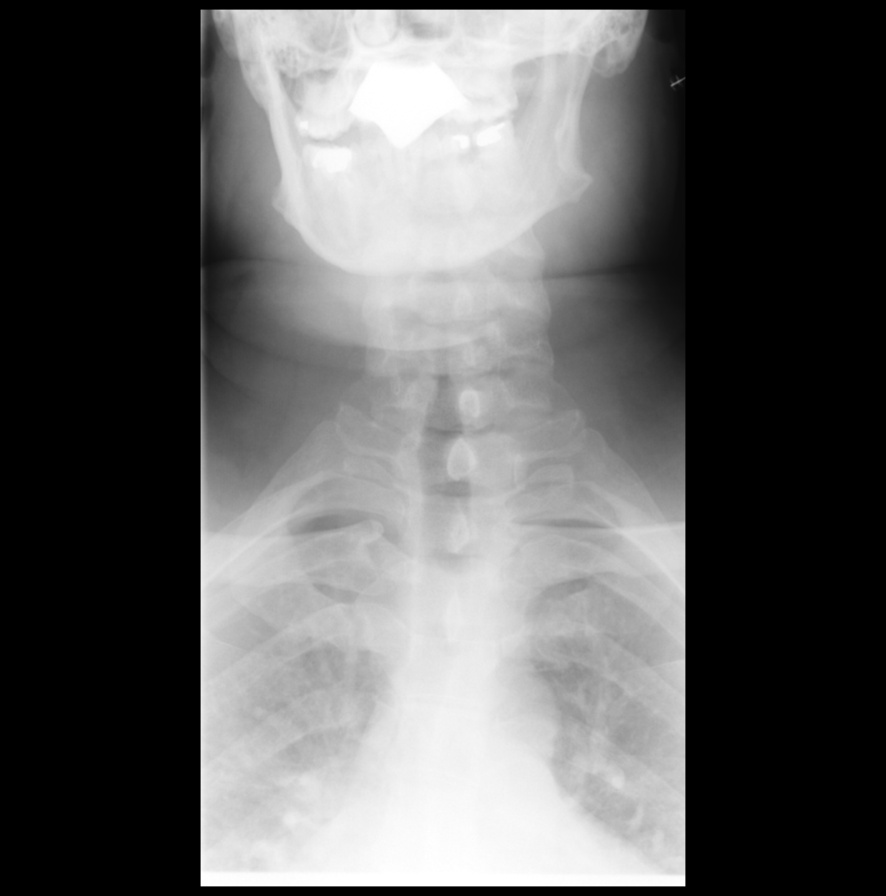

[Series 8: fluoro_barium 2fps_bw · 0.18mm/px · 1 of 1 slices shown (1 of 4)]
[im 1/1]
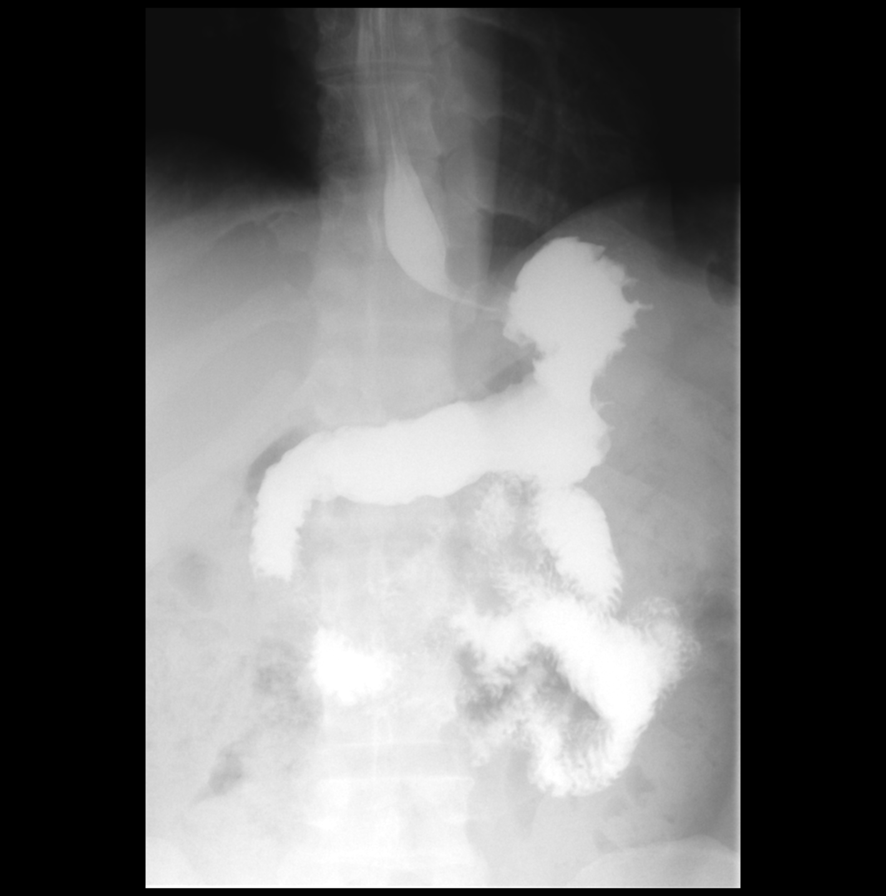

[Series 9: fluoro_barium 2fps_bw · 0.18mm/px · 1 of 1 slices shown (2 of 4)]
[im 1/1]
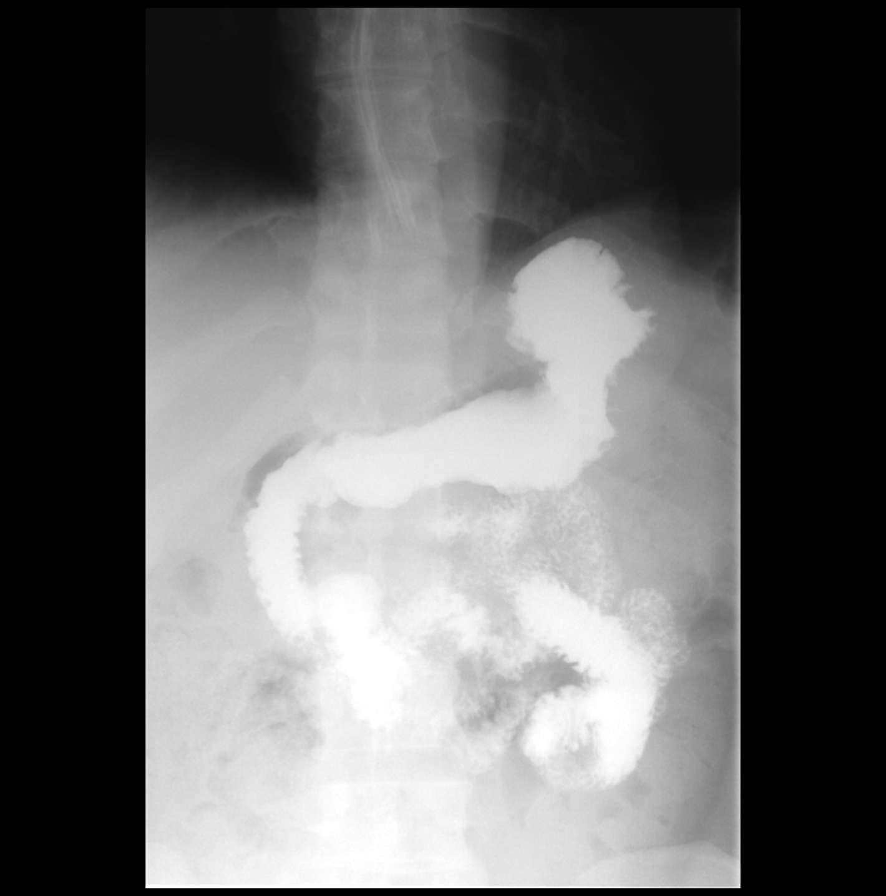

[Series 10: cp_standard · 0.26mm/px · 1 of 1 slices shown]
[im 1/1]
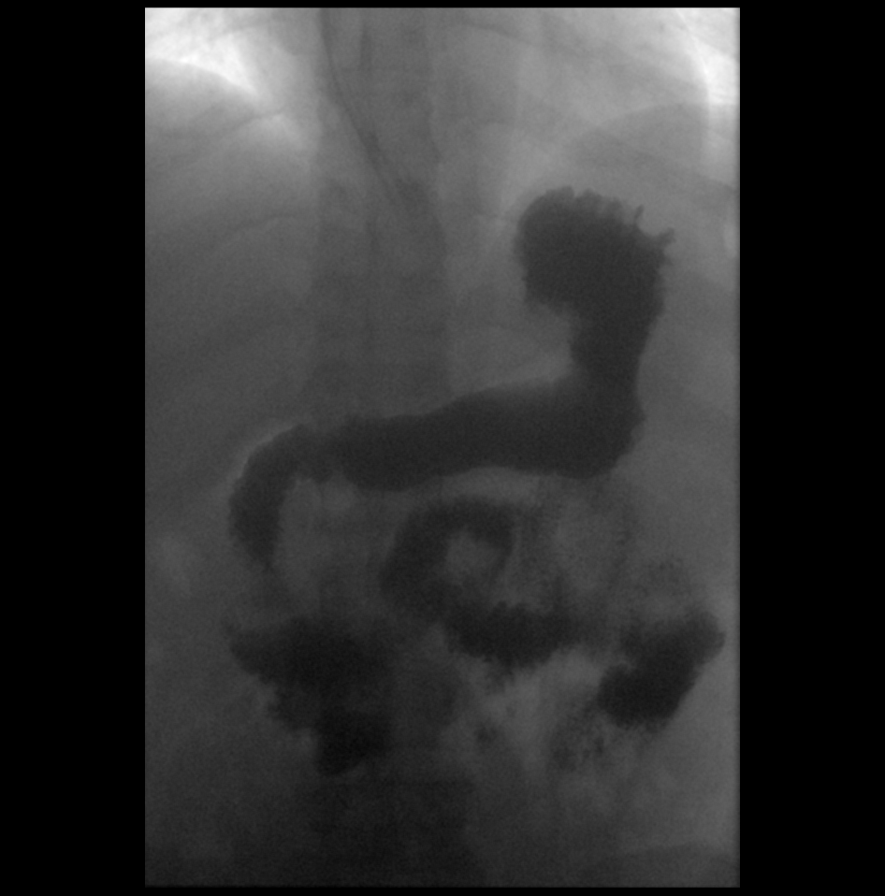

[Series 12: fluoro_barium 2fps_bw · 0.17mm/px · 1 of 1 slices shown (3 of 4)]
[im 1/1]
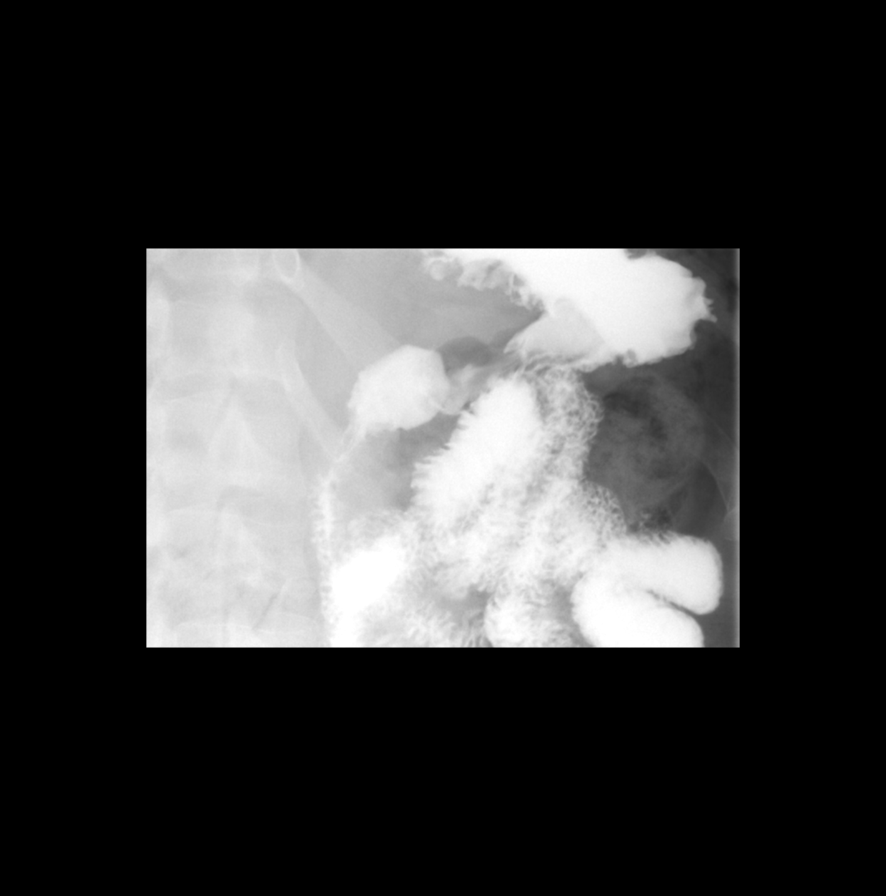

[Series 13: fluoro_barium 2fps_bw · 0.17mm/px · 1 of 1 slices shown (4 of 4)]
[im 1/1]
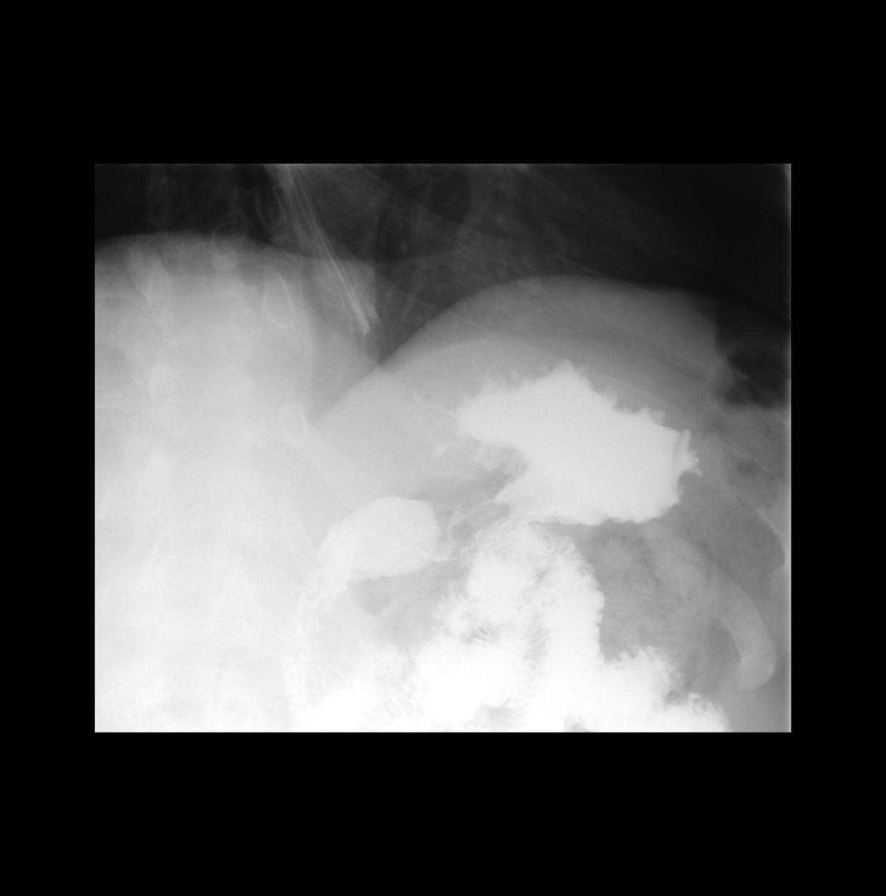

[7 of 24 positions shown; findings below may reference images not displayed]

FINDINGS: Pre-procedure KUB shows a normal bowel gas pattern.

AP and lateral views of the hypopharynx while swallowing barium are
unremarkable. Single contrast imaging of the esophagus is within
normal limits. Stomach is normally positioned and unremarkable on
single contrast imaging. Gastric emptying is prompt. Pylorus and
duodenal bulb are normal. Duodenal C-loop and ligament of Treitz are
in the expected anatomic location.
IMPRESSION: Normal single contrast upper GI series.

## 2021-12-23 DIAGNOSIS — Z1211 Encounter for screening for malignant neoplasm of colon: Secondary | ICD-10-CM | POA: Diagnosis not present

## 2021-12-23 DIAGNOSIS — K635 Polyp of colon: Secondary | ICD-10-CM | POA: Diagnosis not present

## 2021-12-23 DIAGNOSIS — K648 Other hemorrhoids: Secondary | ICD-10-CM | POA: Diagnosis not present

## 2021-12-23 DIAGNOSIS — K6289 Other specified diseases of anus and rectum: Secondary | ICD-10-CM | POA: Diagnosis not present

## 2022-01-22 DIAGNOSIS — E291 Testicular hypofunction: Secondary | ICD-10-CM | POA: Diagnosis not present

## 2022-01-22 DIAGNOSIS — R7309 Other abnormal glucose: Secondary | ICD-10-CM | POA: Diagnosis not present

## 2022-01-22 DIAGNOSIS — D72819 Decreased white blood cell count, unspecified: Secondary | ICD-10-CM | POA: Diagnosis not present

## 2022-01-22 DIAGNOSIS — Z0000 Encounter for general adult medical examination without abnormal findings: Secondary | ICD-10-CM | POA: Diagnosis not present

## 2022-02-21 ENCOUNTER — Encounter (HOSPITAL_COMMUNITY): Payer: Self-pay | Admitting: *Deleted

## 2022-04-04 DIAGNOSIS — E7800 Pure hypercholesterolemia, unspecified: Secondary | ICD-10-CM | POA: Diagnosis not present

## 2022-04-04 DIAGNOSIS — R7303 Prediabetes: Secondary | ICD-10-CM | POA: Diagnosis not present

## 2022-04-04 DIAGNOSIS — M5441 Lumbago with sciatica, right side: Secondary | ICD-10-CM | POA: Diagnosis not present

## 2022-04-11 DIAGNOSIS — G4733 Obstructive sleep apnea (adult) (pediatric): Secondary | ICD-10-CM | POA: Diagnosis not present

## 2022-05-12 DIAGNOSIS — G4733 Obstructive sleep apnea (adult) (pediatric): Secondary | ICD-10-CM | POA: Diagnosis not present

## 2022-06-11 DIAGNOSIS — G4733 Obstructive sleep apnea (adult) (pediatric): Secondary | ICD-10-CM | POA: Diagnosis not present

## 2022-07-28 DIAGNOSIS — R7303 Prediabetes: Secondary | ICD-10-CM | POA: Diagnosis not present

## 2022-10-13 DIAGNOSIS — Z6841 Body Mass Index (BMI) 40.0 and over, adult: Secondary | ICD-10-CM | POA: Diagnosis not present

## 2022-10-13 DIAGNOSIS — G4733 Obstructive sleep apnea (adult) (pediatric): Secondary | ICD-10-CM | POA: Diagnosis not present

## 2022-10-13 DIAGNOSIS — E349 Endocrine disorder, unspecified: Secondary | ICD-10-CM | POA: Diagnosis not present

## 2022-12-23 DIAGNOSIS — R972 Elevated prostate specific antigen [PSA]: Secondary | ICD-10-CM | POA: Diagnosis not present

## 2022-12-23 DIAGNOSIS — E291 Testicular hypofunction: Secondary | ICD-10-CM | POA: Diagnosis not present

## 2022-12-29 DIAGNOSIS — E291 Testicular hypofunction: Secondary | ICD-10-CM | POA: Diagnosis not present

## 2022-12-29 DIAGNOSIS — R972 Elevated prostate specific antigen [PSA]: Secondary | ICD-10-CM | POA: Diagnosis not present

## 2023-01-29 DIAGNOSIS — R7303 Prediabetes: Secondary | ICD-10-CM | POA: Diagnosis not present

## 2023-01-29 DIAGNOSIS — Z0000 Encounter for general adult medical examination without abnormal findings: Secondary | ICD-10-CM | POA: Diagnosis not present

## 2023-01-29 DIAGNOSIS — Z13220 Encounter for screening for lipoid disorders: Secondary | ICD-10-CM | POA: Diagnosis not present

## 2023-01-29 DIAGNOSIS — Z6841 Body Mass Index (BMI) 40.0 and over, adult: Secondary | ICD-10-CM | POA: Diagnosis not present

## 2023-01-29 DIAGNOSIS — E291 Testicular hypofunction: Secondary | ICD-10-CM | POA: Diagnosis not present

## 2023-03-10 DIAGNOSIS — R972 Elevated prostate specific antigen [PSA]: Secondary | ICD-10-CM | POA: Diagnosis not present

## 2023-03-10 DIAGNOSIS — E291 Testicular hypofunction: Secondary | ICD-10-CM | POA: Diagnosis not present

## 2023-03-24 DIAGNOSIS — E291 Testicular hypofunction: Secondary | ICD-10-CM

## 2023-03-24 DIAGNOSIS — R972 Elevated prostate specific antigen [PSA]: Secondary | ICD-10-CM | POA: Diagnosis not present

## 2023-03-24 HISTORY — DX: Testicular hypofunction: E29.1

## 2023-05-26 DIAGNOSIS — C61 Malignant neoplasm of prostate: Secondary | ICD-10-CM | POA: Diagnosis not present

## 2023-06-01 DIAGNOSIS — C61 Malignant neoplasm of prostate: Secondary | ICD-10-CM | POA: Diagnosis not present

## 2023-08-11 ENCOUNTER — Telehealth: Payer: Self-pay | Admitting: Radiation Oncology

## 2023-08-11 NOTE — Telephone Encounter (Signed)
 Left message for patient to call back to schedule consult per 2/17 referral.

## 2023-08-19 ENCOUNTER — Encounter: Payer: Self-pay | Admitting: Radiation Oncology

## 2023-08-19 NOTE — Progress Notes (Signed)
 GU Location of Tumor / Histology:   If Prostate Cancer, Gleason Score is (3 + 4) and PSA is (5.74 on 03/10/2023)  Dorma Russell presented as referral from Dr. Berniece Salines [facility]) elevated PSA  Biopsies      Past/Anticipated interventions by urology, if any: NA  Past/Anticipated interventions by medical oncology, if any: NA  Weight changes, if any: Steady  IPSS: 2-Mild SHIM: 25  Bowel/Bladder complaints, if any: no   Nausea/Vomiting, if any: no  Pain issues, if any:  no  SAFETY ISSUES: Prior radiation? no Pacemaker/ICD? no Possible current pregnancy? None-Male Is the patient on methotrexate? No  Patient is taking Sildenafil. Patient is NOT taking any type of Testerone at this time.  Current Complaints / other details:  No drug allergies noted.  This concludes the interaction.  Ruel Favors, LPN

## 2023-08-27 NOTE — Progress Notes (Signed)
 Radiation Oncology         (336) [phone] ________________________________  Initial Outpatient Consultation - Conducted via MyChart due to current COVID-19 concerns for limiting patient exposure  Name: Lawrence Dalton MRN: [id number]  Date: 08/28/2023  DOB: 05/15/1971  WU:[id number], Dibas, MD  Crist Fat, MD   REFERRING PHYSICIAN: Crist Fat, MD  DIAGNOSIS: 53 y.o. gentleman with Stage T1c adenocarcinoma of the prostate with Gleason score of 3+4, and PSA of 5.74.    ICD-10-CM   1. Malignant neoplasm of prostate (HCC)  C61       HISTORY OF PRESENT ILLNESS: Lawrence Dalton is a 53 y.o. male with a diagnosis of prostate cancer. He has been followed by Dr. Marlou Porch in urology since at least 2017 for a history of low testosterone and has been on testosterone replacement intermittently, in part due to noncompliance with follow ups. His PSA had been stable and under 3 over the years until last year. In 12/2022, his PSA increased to 4.91, up from 1.89 in 06/2021. A repeat PSA obtained 03/11/23 confirmed persistent elevation with a further rise to 5.74. In light of this, Dr. Marlou Porch discontinued his TRT. The patient proceeded to transrectal ultrasound with 12 biopsies of the prostate on 05/26/23.  The prostate volume measured 45 cc.  Out of 12 core biopsies, 6 were positive.  The maximum Gleason score was 3+4, and this was seen in the right apex lateral, right mid lateral (small focus), right mid (small focus), and left base (with perineural invasion). Additionally, Gleason 3+3 was seen in the right base (small focus) and right apex.  The patient reviewed the biopsy results with his urologist and he has kindly been referred today for discussion of potential radiation treatment options.   PREVIOUS RADIATION THERAPY: No  PAST MEDICAL HISTORY:  Past Medical History:  Diagnosis Date   Dental bridge present    lower   Elevated PSA    Hypogonadism, testicular 03/24/2023   Prostate cancer screening     Sleep apnea 2015   C-pap   Umbilical hernia 03/2016   Undescended testicle 2015      PAST SURGICAL HISTORY: Past Surgical History:  Procedure Laterality Date   INSERTION OF MESH N/A 04/10/2016   Procedure: INSERTION OF MESH;  Surgeon: Harriette Bouillon, MD;  Location: [facility];  Service: General;  Laterality: N/A;   LAPAROSCOPIC GASTRIC SLEEVE RESECTION N/A 07/30/2020   Procedure: LAPAROSCOPIC GASTRIC SLEEVE RESECTION;  Surgeon: Gaynelle Adu, MD;  Location: [facility];  Service: General;  Laterality: N/A;   LIPOSUCTION EXTREMITIES Bilateral    thigh   PROSTATE BIOPSY     SOFT TISSUE MASS EXCISION     chest   UMBILICAL HERNIA REPAIR N/A 04/10/2016   Procedure: HERNIA REPAIR UMBILICAL ADULT;  Surgeon: Harriette Bouillon, MD;  Location: [facility];  Service: General;  Laterality: N/A;   UPPER GI ENDOSCOPY N/A 07/30/2020   Procedure: UPPER GI ENDOSCOPY;  Surgeon: Gaynelle Adu, MD;  Location: [facility];  Service: General;  Laterality: N/A;    FAMILY HISTORY:  Family History  Problem Relation Age of Onset   Asthma Maternal Grandmother    Asthma Maternal Aunt     SOCIAL HISTORY: He is self-employed Social History   Socioeconomic History   Marital status: Married    Spouse name: Not on file   Number of children: 0   Years of education: Not on file   Highest education level: Not on file  Occupational History  Not on file  Tobacco Use   Smoking status: Some Days    Current packs/day: 1.00    Average packs/day: 1 pack/day for 1 year (1.0 ttl pk-yrs)    Types: Cigars, Cigarettes   Smokeless tobacco: Never  Vaping Use   Vaping status: Never Used  Substance and Sexual Activity   Alcohol use: Yes    Comment: occasionally   Drug use: No   Sexual activity: Not on file  Other Topics Concern   Not on file  Social History Narrative   Not on file   Social Drivers of Health   Financial Resource Strain: Not on file  Food Insecurity: No Food Insecurity  (08/28/2023)   Hunger Vital Sign    Worried About Running Out of Food in the Last Year: Never true    Ran Out of Food in the Last Year: Never true  Transportation Needs: No Transportation Needs (08/28/2023)   PRAPARE - Administrator, Civil Service (Medical): No    Lack of Transportation (Non-Medical): No  Physical Activity: Not on file  Stress: Not on file  Social Connections: Not on file  Intimate Partner Violence: Not At Risk (08/28/2023)   Humiliation, Afraid, Rape, and Kick questionnaire    Fear of Current or Ex-Partner: No    Emotionally Abused: No    Physically Abused: No    Sexually Abused: No    ALLERGIES: Other  MEDICATIONS:  Current Outpatient Medications  Medication Sig Dispense Refill   sildenafil (REVATIO) 20 MG tablet Take 20 mg by mouth as needed.     Multiple Vitamin (MULTIVITAMIN WITH MINERALS) TABS tablet Take 1 tablet by mouth daily.     testosterone cypionate (DEPOTESTOTERONE CYPIONATE) 100 MG/ML injection Inject 125 mg into the muscle every Friday. For IM use only (Patient not taking: Reported on 08/28/2023)     No current facility-administered medications for this encounter.    REVIEW OF SYSTEMS:  On review of systems, the patient reports that he is doing well overall. He denies any chest pain, shortness of breath, cough, fevers, chills, night sweats, unintended weight changes. He denies any bowel disturbances, and denies abdominal pain, nausea or vomiting. He denies any new musculoskeletal or joint aches or pains. His IPSS was 2, indicating minimal urinary symptoms. His SHIM was 25, indicating he does not have erectile dysfunction. A complete review of systems is obtained and is otherwise negative.    PHYSICAL EXAM:  Wt Readings from Last 3 Encounters:  08/28/23 (!) 343 lb (155.6 kg)  01/08/21 (!) 330 lb 9.6 oz (150 kg)  11/06/20 (!) 325 lb (147.4 kg)   Temp Readings from Last 3 Encounters:  07/31/20 99.4 F (37.4 C) (Oral)  07/25/20 98.7 F (37.1  C) (Oral)  04/10/16 97.8 F (36.6 C) (Oral)   BP Readings from Last 3 Encounters:  07/31/20 (!) 142/96  07/25/20 (!) 161/99  04/10/16 (!) 177/96   Pulse Readings from Last 3 Encounters:  07/31/20 98  07/25/20 91  04/10/16 84   Pain Assessment Pain Score: 0-No pain/10  In general this is a well appearing African-American man in no acute distress. He's alert and oriented x4 and appropriate throughout the examination. Cardiopulmonary assessment is negative for acute distress and he exhibits normal effort.    KPS = 100  100 - Normal; no complaints; no evidence of disease. 90   - Able to carry on normal activity; minor signs or symptoms of disease. 80   - Normal activity with effort;  some signs or symptoms of disease. 65   - Cares for self; unable to carry on normal activity or to do active work. 60   - Requires occasional assistance, but is able to care for most of his personal needs. 50   - Requires considerable assistance and frequent medical care. 40   - Disabled; requires special care and assistance. 30   - Severely disabled; hospital admission is indicated although death not imminent. 20   - Very sick; hospital admission necessary; active supportive treatment necessary. 10   - Moribund; fatal processes progressing rapidly. 0     - Dead  Karnofsky DA, Abelmann WH, Craver LS and [place] JH [phone]) The use of the nitrogen mustards in the palliative treatment of carcinoma: with particular reference to bronchogenic carcinoma Cancer 1 634-56  LABORATORY DATA:  Lab Results  Component Value Date   WBC 10.5 07/31/2020   HGB 16.7 07/31/2020   HCT 51.5 07/31/2020   MCV 88.2 07/31/2020   PLT 239 07/31/2020   Lab Results  Component Value Date   NA 137 07/31/2020   K 4.2 07/31/2020   CL 101 07/31/2020   CO2 25 07/31/2020   Lab Results  Component Value Date   ALT 30 07/31/2020   AST 28 07/31/2020   ALKPHOS 59 07/31/2020   BILITOT 0.7 07/31/2020     RADIOGRAPHY: No  results found.    IMPRESSION/PLAN: This visit was conducted via MyChart to spare the patient unnecessary potential exposure in the healthcare setting during the current COVID-19 pandemic. 1. 53 y.o. gentleman with Stage T1c adenocarcinoma of the prostate with Gleason Score of 3+4, and PSA of 5.74. We discussed the patient's workup and outlined the nature of prostate cancer in this setting. The patient's T stage, Gleason's score, and PSA put him into the favorable intermediate risk group. Accordingly, he is eligible for a variety of potential treatment options including brachytherapy, 5.5 weeks of external radiation, or prostatectomy. We discussed the available radiation techniques, and focused on the details and logistics and delivery. We discussed and outlined the risks, benefits, short and long-term effects associated with radiotherapy and compared and contrasted these with prostatectomy. We discussed the role of SpaceOAR in reducing the rectal toxicity associated with radiotherapy. He appears to have a good understanding of his disease and our treatment recommendations which are of curative intent.  He was encouraged to ask questions that were answered to his stated satisfaction.  At the end of the conversation the patient is interested in moving forward with brachytherapy and use of SpaceOAR gel to reduce rectal toxicity from radiotherapy.  We will share our discussion with Dr. Marlou Porch and move forward with scheduling his CT [facility] planning appointment in the near future.  The patient will be contacted by Darryl Nestle in our office who will be working closely with him to coordinate OR scheduling and pre and post procedure appointments.  We will contact the pharmaceutical rep to ensure that SpaceOAR is available at the time of procedure.  We enjoyed meeting him today and look forward to continuing to participate in his care.  He knows that he is welcome to call at anytime in the interim with any further  questions or concerns.  Given current concerns for patient exposure during the COVID-19 pandemic, this encounter was conducted via video-enabled MyChart visit. The patient has given verbal consent for this type of encounter. The attendants for this meeting include Margaretmary Dys MD, Orie Baxendale PA-C, and patient, Ricardo Schubach. During the  encounter, Margaretmary Dys MD and Marcello Fennel PA-C were located at [facility] Radiation Oncology Department.  Patient, Vittorio Mohs was located at home.  We personally spent 60 minutes in this encounter including chart review, reviewing radiological studies, meeting face-to-face with the patient, entering orders, coordinating care and completing documentation.    Marguarite Arbour, PA-C    Margaretmary Dys, MD  [facility]  Radiation Oncology Direct Dial: [phone]  Fax: [phone] [email or web address]  Skype  [email or web address]   This document serves as a record of services personally performed by Margaretmary Dys, MD and Marcello Fennel, PA-C. It was created on their behalf by Mickie Bail, a trained medical scribe. The creation of this record is based on the scribe's personal observations and the provider's statements to them. This document has been checked and approved by the attending provider.

## 2023-08-28 ENCOUNTER — Ambulatory Visit
Admission: RE | Admit: 2023-08-28 | Discharge: 2023-08-28 | Disposition: A | Discharge status: Home / Self Care | Payer: BC Managed Care – PPO | Source: Ambulatory Visit | Attending: Radiation Oncology | Admitting: Radiation Oncology

## 2023-08-28 ENCOUNTER — Encounter: Payer: Self-pay | Admitting: Radiation Oncology

## 2023-08-28 VITALS — Ht 72.0 in | Wt 343.0 lb

## 2023-08-28 DIAGNOSIS — C61 Malignant neoplasm of prostate: Secondary | ICD-10-CM

## 2023-08-28 HISTORY — DX: Encounter for screening for malignant neoplasm of prostate: Z12.5

## 2023-08-28 HISTORY — DX: Elevated prostate specific antigen (PSA): R97.20

## 2023-09-01 NOTE — Progress Notes (Signed)
 RN left message with patient for call back to introduce my role and address any barriers or questions prior to treatment decision of brachytherapy.

## 2023-09-02 ENCOUNTER — Telehealth: Payer: Self-pay | Admitting: *Deleted

## 2023-09-02 ENCOUNTER — Other Ambulatory Visit: Payer: Self-pay | Admitting: Urology

## 2023-09-02 NOTE — Telephone Encounter (Signed)
 CALLED PATIENT TO INFORM OF PRE-SEED APPTS. FOR 09-24-23 AND HIS IMPLANT ON 10-15-23, SPOKE WITH PATIENT AND HE IS AWARE OF THESE APPTS.

## 2023-09-11 NOTE — Progress Notes (Signed)
 RN left message for patient providing my direct contact for any questions regarding brachytherapy.

## 2023-09-22 ENCOUNTER — Telehealth: Payer: Self-pay | Admitting: *Deleted

## 2023-09-22 NOTE — Telephone Encounter (Signed)
 CALLED PATIENT TO REMIND OF PRE-SEED APPTS. FOR 09-24-23, SPOKE WITH PATIENT AND HE IS AWARE OF THESE APPTS.

## 2023-09-24 ENCOUNTER — Ambulatory Visit
Admission: RE | Admit: 2023-09-24 | Discharge: 2023-09-24 | Disposition: A | Discharge status: Home / Self Care | Payer: Self-pay | Source: Ambulatory Visit | Attending: Urology | Admitting: Urology

## 2023-09-24 ENCOUNTER — Ambulatory Visit
Admission: RE | Admit: 2023-09-24 | Discharge: 2023-09-24 | Disposition: A | Discharge status: Home / Self Care | Source: Ambulatory Visit | Attending: Radiation Oncology | Admitting: Radiation Oncology

## 2023-09-24 ENCOUNTER — Encounter: Payer: Self-pay | Admitting: Urology

## 2023-09-24 VITALS — Ht 72.0 in | Wt 347.0 lb

## 2023-09-24 DIAGNOSIS — C61 Malignant neoplasm of prostate: Secondary | ICD-10-CM | POA: Diagnosis present

## 2023-09-24 NOTE — Progress Notes (Signed)
 Pre-seed nursing interview for a diagnosis of Malignant neoplasm of prostate (HCC) Stage IIB (cT1c, cN0, cM0, PSA: 5.7, Grade Group: 2).  Patient identity verified x2.   Patient reports doing well. Denies any related issues at this time.  Meaningful use complete.  Urinary Management medication(s)- None Urology appointment date- 10/15/2023, with Dr. Marlou Porch at [facility]  Ht 6' (1.829 m)   Wt (!) 347 lb (157.4 kg)   BMI 47.06 kg/m   This concludes the interaction.  Ruel Favors, LPN

## 2023-09-24 NOTE — Progress Notes (Signed)
 Radiation Oncology         [phone]) [phone] ________________________________  Outpatient Follow up- Pre-seed visit  Name: Lawrence Dalton MRN: [id number]  Date: 09/24/2023  DOB: [birth year]  YQ:[id number], Dibas, MD  Crist Fat, MD   REFERRING PHYSICIAN: Crist Fat, MD  DIAGNOSIS: 53 y.o. gentleman with  Stage T1c adenocarcinoma of the prostate with Gleason score of 3+4, and PSA of 5.74.     ICD-10-CM   1. Malignant neoplasm of prostate (HCC)  C61       HISTORY OF PRESENT ILLNESS: Lawrence Dalton is a 53 y.o. male with a diagnosis of prostate cancer.  He has been followed by Dr. Marlou Porch in urology since at least 2017 for a history of low testosterone and has been on testosterone replacement intermittently, in part due to noncompliance with follow ups. His PSA had been stable and under 3 over the years until last year. In 12/2022, his PSA increased to 4.91, up from 1.89 in 06/2021. A repeat PSA obtained 03/11/23 confirmed persistent elevation with a further rise to 5.74. In light of this, Dr. Marlou Porch discontinued his TRT. The patient proceeded to transrectal ultrasound with 12 biopsies of the prostate on 05/26/23.  The prostate volume measured 45 cc.  Out of 12 core biopsies, 6 were positive.  The maximum Gleason score was 3+4, and this was seen in the right apex lateral, right mid lateral (small focus), right mid (small focus), and left base (with perineural invasion). Additionally, Gleason 3+3 was seen in the right base (small focus) and right apex.   The patient reviewed the biopsy results with his urologist and was kindly referred to [place] for discussion of potential radiation treatment options. We initially met the patient on 08/28/23 and he was most interested in proceeding with brachytherapy and SpaceOAR gel placement for treatment of his disease. He is here today for his pre-procedure imaging for planning and to answer any additional questions he may have about this treatment.   PREVIOUS  RADIATION THERAPY: No  PAST MEDICAL HISTORY:  Past Medical History:  Diagnosis Date   Dental bridge present    lower   Elevated PSA    Hypogonadism, testicular 03/24/2023   Prostate cancer screening    Sleep apnea 2015   C-pap   Umbilical hernia 03/2016   Undescended testicle 2015      PAST SURGICAL HISTORY: Past Surgical History:  Procedure Laterality Date   INSERTION OF MESH N/A 04/10/2016   Procedure: INSERTION OF MESH;  Surgeon: Harriette Bouillon, MD;  Location: [facility];  Service: General;  Laterality: N/A;   LAPAROSCOPIC GASTRIC SLEEVE RESECTION N/A 07/30/2020   Procedure: LAPAROSCOPIC GASTRIC SLEEVE RESECTION;  Surgeon: Gaynelle Adu, MD;  Location: [facility];  Service: General;  Laterality: N/A;   LIPOSUCTION EXTREMITIES Bilateral    thigh   PROSTATE BIOPSY     SOFT TISSUE MASS EXCISION     chest   UMBILICAL HERNIA REPAIR N/A 04/10/2016   Procedure: HERNIA REPAIR UMBILICAL ADULT;  Surgeon: Harriette Bouillon, MD;  Location: [facility];  Service: General;  Laterality: N/A;   UPPER GI ENDOSCOPY N/A 07/30/2020   Procedure: UPPER GI ENDOSCOPY;  Surgeon: Gaynelle Adu, MD;  Location: [facility];  Service: General;  Laterality: N/A;    FAMILY HISTORY:  Family History  Problem Relation Age of Onset   Asthma Maternal Grandmother    Asthma Maternal Aunt     SOCIAL HISTORY:  Social History   Socioeconomic History   Marital  status: Married    Spouse name: Not on file   Number of children: 0   Years of education: Not on file   Highest education level: Not on file  Occupational History   Not on file  Tobacco Use   Smoking status: Some Days    Current packs/day: 1.00    Average packs/day: 1 pack/day for 1 year (1.0 ttl pk-yrs)    Types: Cigars, Cigarettes   Smokeless tobacco: Never  Vaping Use   Vaping status: Never Used  Substance and Sexual Activity   Alcohol use: Yes    Comment: occasionally   Drug use: No   Sexual activity: Not on file   Other Topics Concern   Not on file  Social History Narrative   Not on file   Social Drivers of Health   Financial Resource Strain: Not on file  Food Insecurity: No Food Insecurity (08/28/2023)   Hunger Vital Sign    Worried About Running Out of Food in the Last Year: Never true    Ran Out of Food in the Last Year: Never true  Transportation Needs: No Transportation Needs (08/28/2023)   PRAPARE - Administrator, Civil Service (Medical): No    Lack of Transportation (Non-Medical): No  Physical Activity: Not on file  Stress: Not on file  Social Connections: Not on file  Intimate Partner Violence: Not At Risk (08/28/2023)   Humiliation, Afraid, Rape, and Kick questionnaire    Fear of Current or Ex-Partner: No    Emotionally Abused: No    Physically Abused: No    Sexually Abused: No    ALLERGIES: Other  MEDICATIONS:  Current Outpatient Medications  Medication Sig Dispense Refill   Multiple Vitamin (MULTIVITAMIN WITH MINERALS) TABS tablet Take 1 tablet by mouth daily.     sildenafil (REVATIO) 20 MG tablet Take 20 mg by mouth as needed.     testosterone cypionate (DEPOTESTOTERONE CYPIONATE) 100 MG/ML injection Inject 125 mg into the muscle every Friday. For IM use only (Patient not taking: Reported on 08/28/2023)     No current facility-administered medications for this encounter.    REVIEW OF SYSTEMS:  On review of systems, the patient reports that he is doing well overall. He denies any chest pain, shortness of breath, cough, fevers, chills, night sweats, unintended weight changes. He denies any bowel disturbances, and denies abdominal pain, nausea or vomiting. He denies any new musculoskeletal or joint aches or pains. His IPSS was 2, indicating minimal urinary symptoms. His SHIM was 25, indicating he does not have erectile dysfunction. A complete review of systems is obtained and is otherwise negative.     PHYSICAL EXAM:  Wt Readings from Last 3 Encounters:  09/24/23 (!)  347 lb (157.4 kg)  08/28/23 (!) 343 lb (155.6 kg)  01/08/21 (!) 330 lb 9.6 oz (150 kg)   Temp Readings from Last 3 Encounters:  07/31/20 99.4 F (37.4 C) (Oral)  07/25/20 98.7 F (37.1 C) (Oral)  04/10/16 97.8 F (36.6 C) (Oral)   BP Readings from Last 3 Encounters:  07/31/20 (!) 142/96  07/25/20 (!) 161/99  04/10/16 (!) 177/96   Pulse Readings from Last 3 Encounters:  07/31/20 98  07/25/20 91  04/10/16 84   Pain Assessment Pain Score: 0-No pain/10  In general this is a well appearing African American male in no acute distress. He's alert and oriented x4 and appropriate throughout the examination. Cardiopulmonary assessment is negative for acute distress, and he exhibits normal effort.  KPS = 100  100 - Normal; no complaints; no evidence of disease. 90   - Able to carry on normal activity; minor signs or symptoms of disease. 80   - Normal activity with effort; some signs or symptoms of disease. 15   - Cares for self; unable to carry on normal activity or to do active work. 60   - Requires occasional assistance, but is able to care for most of his personal needs. 50   - Requires considerable assistance and frequent medical care. 40   - Disabled; requires special care and assistance. 30   - Severely disabled; hospital admission is indicated although death not imminent. 20   - Very sick; hospital admission necessary; active supportive treatment necessary. 10   - Moribund; fatal processes progressing rapidly. 0     - Dead  Karnofsky DA, Abelmann WH, Craver LS and [place] JH [phone]) The use of the nitrogen mustards in the palliative treatment of carcinoma: with particular reference to bronchogenic carcinoma Cancer 1 634-56  LABORATORY DATA:  Lab Results  Component Value Date   WBC 10.5 07/31/2020   HGB 16.7 07/31/2020   HCT 51.5 07/31/2020   MCV 88.2 07/31/2020   PLT 239 07/31/2020   Lab Results  Component Value Date   NA 137 07/31/2020   K 4.2 07/31/2020   CL  101 07/31/2020   CO2 25 07/31/2020   Lab Results  Component Value Date   ALT 30 07/31/2020   AST 28 07/31/2020   ALKPHOS 59 07/31/2020   BILITOT 0.7 07/31/2020     RADIOGRAPHY: No results found.    IMPRESSION/PLAN: 1. 53 y.o. gentleman with  Stage T1c adenocarcinoma of the prostate with Gleason score of 3+4, and PSA of 5.74.  The patient has elected to proceed with seed implant for treatment of his disease. We reviewed the risks, benefits, short and long-term effects associated with brachytherapy and discussed the role of SpaceOAR in reducing the rectal toxicity associated with radiotherapy.  He appears to have a good understanding of his disease and our treatment recommendations which are of curative intent.  He was encouraged to ask questions that were answered to his stated satisfaction. He has freely signed written consent to proceed today in the office and a copy of this document will be placed in his medical record. His procedure is tentatively scheduled for 10/15/23 in collaboration with Dr. Marlou Porch and we will see him back for his post-procedure visit approximately 3 weeks thereafter. We look forward to continuing to participate in his care. He knows that he is welcome to call with any questions or concerns at any time in the interim.  I personally spent 30 minutes in this encounter including chart review, reviewing radiological studies, meeting face-to-face with the patient, entering orders and completing documentation.    Marguarite Arbour, MMS, PA-C [place]  [facility] Radiation Oncology Physician Assistant Direct Dial: [phone]  Fax: [phone]

## 2023-09-24 NOTE — Progress Notes (Signed)
  Radiation Oncology         [phone]) [phone] ________________________________  Name: [facility] MRN: [id number]  Date: 09/24/2023  DOB: [birth year]  SIMULATION AND TREATMENT PLANNING NOTE PUBIC ARCH STUDY  [id number], Dibas, MD  Crist Fat, MD  DIAGNOSIS: 53 y.o. gentleman with Stage T1c adenocarcinoma of the prostate with Gleason score of 3+4, and PSA of 5.74.   Oncology History  Malignant neoplasm of prostate (HCC)  05/26/2023 Cancer Staging   Staging form: Prostate, AJCC 8th Edition - Clinical stage from 05/26/2023: Stage IIB (cT1c, cN0, cM0, PSA: 5.7, Grade Group: 2) - Signed by Marcello Fennel, PA-C on 08/28/2023 Histopathologic type: Adenocarcinoma, NOS Stage prefix: Initial diagnosis Prostate specific antigen (PSA) range: Less than 10 Gleason primary pattern: 3 Gleason secondary pattern: 4 Gleason score: 7 Histologic grading system: 5 grade system Number of biopsy cores examined: 12 Number of biopsy cores positive: 6 Location of positive needle core biopsies: Both sides   08/28/2023 Initial Diagnosis   Malignant neoplasm of prostate (HCC)       ICD-10-CM   1. Malignant neoplasm of prostate (HCC)  C61       COMPLEX SIMULATION:  The patient presented today for evaluation for possible prostate seed implant. He was brought to the radiation planning suite and placed supine on the CT couch. A 3-dimensional image study set was obtained in upload to the planning computer. There, on each axial slice, I contoured the prostate gland. Then, using three-dimensional radiation planning tools I reconstructed the prostate in view of the structures from the transperineal needle pathway to assess for possible pubic arch interference. In doing so, I did not appreciate any pubic arch interference. Also, the patient's prostate volume was estimated based on the drawn structure. The volume was 46 cc.  Given the pubic arch appearance and prostate volume, patient remains a good candidate to proceed  with prostate seed implant. Today, he freely provided informed written consent to proceed.    PLAN: The patient will undergo prostate seed implant.   ________________________________  Artist Pais. Kathrynn Running, M.D.

## 2023-10-02 NOTE — Progress Notes (Signed)
 COVID Vaccine received:  [x]  No []  Yes Date of any COVID positive Test in last 90 days: no PCP - Dibas Koirala MD Cardiologist - n/a  Chest x-ray -  EKG -   Stress Test -  ECHO -  Cardiac Cath -   Bowel Prep - [x]  No  []   Yes ______  Pacemaker / ICD device [x]  No []  Yes   Spinal Cord Stimulator:[x]  No []  Yes       History of Sleep Apnea? []  No [x]  Yes   CPAP used?- []  No [x]  Yes    Does the patient monitor blood sugar?          [x]  No []  Yes  []  N/A  Patient has: [x]  NO Hx DM   []  Pre-DM                 []  DM1  []   DM2 Does patient have a [employer] or [employer]? []  No []  Yes   Fasting Blood Sugar Ranges-  Checks Blood Sugar _____ times a day  GLP1 agonist / usual dose - no GLP1 instructions:  SGLT-2 inhibitors / usual dose - no SGLT-2 instructions:   Blood Thinner / Instructions:no Aspirin Instructions:no  Comments:   Activity level: Patient is able  to climb a flight of stairs without difficulty; [x]  No CP  [x]  No SOB,    Patient can  perform ADLs without assistance.   Anesthesia review:   Patient denies shortness of breath, fever, cough and chest pain at PAT appointment.  Patient verbalized understanding and agreement to the Pre-Surgical Instructions that were given to them at this PAT appointment. Patient was also educated of the need to review these PAT instructions again prior to his/her surgery.I reviewed the appropriate phone numbers to call if they have any and questions or concerns.

## 2023-10-02 NOTE — Patient Instructions (Addendum)
 SURGICAL WAITING ROOM VISITATION  Patients having surgery or a procedure may have no more than 2 support people in the waiting area - these visitors may rotate.    Children under the age of 41 must have an adult with them who is not the patient.  Due to an increase in RSV and influenza rates and associated hospitalizations, children ages 18 and under may not visit patients in [facility] hospitals.  Visitors with respiratory illnesses are discouraged from visiting and should remain at home.  If the patient needs to stay at the hospital during part of their recovery, the visitor guidelines for inpatient rooms apply. Pre-op nurse will coordinate an appropriate time for 1 support person to accompany patient in pre-op.  This support person may not rotate.    Please refer to the [facility] website for the visitor guidelines for Inpatients (after your surgery is over and you are in a regular room).       Your procedure is scheduled on: 10/15/23   Report to [facility] Main Entrance    Report to admitting at 7 AM   Call this number if you have problems the morning of surgery [phone]   Do not eat food or drink liquids:After Midnight.May have sips of water with meds.     FOLLOW BOWEL PREP AND ANY ADDITIONAL PRE OP INSTRUCTIONS YOU RECEIVED FROM YOUR SURGEON'S OFFICE!!!     Oral Hygiene is also important to reduce your risk of infection.                                    Remember - BRUSH YOUR TEETH THE MORNING OF SURGERY WITH YOUR REGULAR TOOTHPASTE   Stop all vitamins and herbal supplements 7 days before surgery.   Take these medicines the morning of surgery with A SIP OF WATER:   Bring CPAP mask and tubing day of surgery.                              You may not have any metal on your body including hair pins, jewelry, and body piercing             Do not wear make-up, lotions, powders, perfumes/cologne, or deodorant              Men may shave face and neck.   Do  not bring valuables to the hospital. [facility] IS NOT             RESPONSIBLE   FOR VALUABLES.   Contacts, glasses, dentures or bridgework may not be worn into surgery.  DO NOT BRING YOUR HOME MEDICATIONS TO THE HOSPITAL. PHARMACY WILL DISPENSE MEDICATIONS LISTED ON YOUR MEDICATION LIST TO YOU DURING YOUR ADMISSION IN THE HOSPITAL!    Patients discharged on the day of surgery will not be allowed to drive home.  Someone NEEDS to stay with you for the first 24 hours after anesthesia.   Special Instructions: Bring a copy of your healthcare power of attorney and living will documents the day of surgery if you haven't scanned them before.              Please read over the following fact sheets you were given: IF YOU HAVE QUESTIONS ABOUT YOUR PRE-OP INSTRUCTIONS PLEASE CALL [phone] [facility]   If you received a COVID test during your pre-op visit  it is requested that you wear a mask when out in public, stay away from anyone that may not be feeling well and notify your surgeon if you develop symptoms. If you test positive for Covid or have been in contact with anyone that has tested positive in the last 10 days please notify you surgeon.    [facility] - Preparing for Surgery Before surgery, you can play an important role.  Because skin is not sterile, your skin needs to be as free of germs as possible.  You can reduce the number of germs on your skin by washing with CHG (chlorahexidine gluconate) soap before surgery.  CHG is an antiseptic cleaner which kills germs and bonds with the skin to continue killing germs even after washing. Please DO NOT use if you have an allergy to CHG or antibacterial soaps.  If your skin becomes reddened/irritated stop using the CHG and inform your nurse when you arrive at Short Stay. Do not shave (including legs and underarms) for at least 48 hours prior to the first CHG shower.  You may shave your face/neck.  Please follow these instructions carefully:  1.   Shower with CHG Soap the night before surgery and the  morning of surgery.  2.  If you choose to wash your hair, wash your hair first as usual with your normal  shampoo.  3.  After you shampoo, rinse your hair and body thoroughly to remove the shampoo.                             4.  Use CHG as you would any other liquid soap.  You can apply chg directly to the skin and wash.  Gently with a scrungie or clean washcloth.  5.  Apply the CHG Soap to your body ONLY FROM THE NECK DOWN.   Do   not use on face/ open                           Wound or open sores. Avoid contact with eyes, ears mouth and   genitals (private parts).                       Wash face,  Genitals (private parts) with your normal soap.             6.  Wash thoroughly, paying special attention to the area where your    surgery  will be performed.  7.  Thoroughly rinse your body with warm water from the neck down.  8.  DO NOT shower/wash with your normal soap after using and rinsing off the CHG Soap.                9.  Pat yourself dry with a clean towel.            10.  Wear clean pajamas.            11.  Place clean sheets on your bed the night of your first shower and do not  sleep with pets. Day of Surgery : Do not apply any lotions/deodorants the morning of surgery.  Please wear clean clothes to the hospital/surgery center.  FAILURE TO FOLLOW THESE INSTRUCTIONS MAY RESULT IN THE CANCELLATION OF YOUR SURGERY  PATIENT SIGNATURE_________________________________  NURSE SIGNATURE__________________________________  ________________________________________________________________________

## 2023-10-05 ENCOUNTER — Encounter (HOSPITAL_COMMUNITY): Payer: Self-pay

## 2023-10-05 ENCOUNTER — Other Ambulatory Visit: Payer: Self-pay

## 2023-10-05 ENCOUNTER — Encounter (HOSPITAL_COMMUNITY)
Admission: RE | Admit: 2023-10-05 | Discharge: 2023-10-05 | Disposition: A | Discharge status: Home / Self Care | Source: Ambulatory Visit | Attending: Urology | Admitting: Urology

## 2023-10-05 VITALS — BP 147/93 | HR 83 | Temp 98.2°F | Resp 18 | Ht 72.0 in | Wt 353.0 lb

## 2023-10-05 DIAGNOSIS — Z01812 Encounter for preprocedural laboratory examination: Secondary | ICD-10-CM | POA: Insufficient documentation

## 2023-10-05 DIAGNOSIS — Z01818 Encounter for other preprocedural examination: Secondary | ICD-10-CM

## 2023-10-05 HISTORY — DX: Malignant (primary) neoplasm, unspecified: C80.1

## 2023-10-05 LAB — CBC
HCT: 42.4 % (ref 39.0–52.0)
Hemoglobin: 13.6 g/dL (ref 13.0–17.0)
MCH: 29.4 pg (ref 26.0–34.0)
MCHC: 32.1 g/dL (ref 30.0–36.0)
MCV: 91.6 fL (ref 80.0–100.0)
Platelets: 211 10*3/uL (ref 150–400)
RBC: 4.63 MIL/uL (ref 4.22–5.81)
RDW: 13.4 % (ref 11.5–15.5)
WBC: 5.7 10*3/uL (ref 4.0–10.5)
nRBC: 0 % (ref 0.0–0.2)

## 2023-10-05 LAB — NO BLOOD PRODUCTS

## 2023-10-05 NOTE — Progress Notes (Signed)
 Pt. Arrived for PST appointment. During interview he revealed that he refuses blood products. A blood refusal form was signed by patient.

## 2023-10-14 ENCOUNTER — Telehealth: Payer: Self-pay | Admitting: *Deleted

## 2023-10-14 NOTE — Telephone Encounter (Signed)
 CALLED PATIENT TO REMIND OF PROCEDURE FOR 10-15-23, SPOKE WITH PATIENT AND HE IS AWARE OF THIS PROCEDURE

## 2023-10-15 ENCOUNTER — Encounter (HOSPITAL_COMMUNITY): Payer: Self-pay | Admitting: Urology

## 2023-10-15 ENCOUNTER — Ambulatory Visit (HOSPITAL_COMMUNITY)

## 2023-10-15 ENCOUNTER — Encounter (HOSPITAL_COMMUNITY)
Admission: RE | Disposition: A | Discharge status: Home / Self Care | Payer: Self-pay | Source: Ambulatory Visit | Attending: Urology

## 2023-10-15 ENCOUNTER — Ambulatory Visit (HOSPITAL_COMMUNITY): Payer: Self-pay | Admitting: Anesthesiology

## 2023-10-15 ENCOUNTER — Ambulatory Visit (HOSPITAL_COMMUNITY)
Admission: RE | Admit: 2023-10-15 | Discharge: 2023-10-15 | Disposition: A | Discharge status: Home / Self Care | Source: Ambulatory Visit | Attending: Urology | Admitting: Urology

## 2023-10-15 ENCOUNTER — Other Ambulatory Visit: Payer: Self-pay

## 2023-10-15 ENCOUNTER — Ambulatory Visit (HOSPITAL_BASED_OUTPATIENT_CLINIC_OR_DEPARTMENT_OTHER): Payer: Self-pay | Admitting: Anesthesiology

## 2023-10-15 DIAGNOSIS — F17210 Nicotine dependence, cigarettes, uncomplicated: Secondary | ICD-10-CM | POA: Insufficient documentation

## 2023-10-15 DIAGNOSIS — Z6841 Body Mass Index (BMI) 40.0 and over, adult: Secondary | ICD-10-CM | POA: Diagnosis not present

## 2023-10-15 DIAGNOSIS — Z9884 Bariatric surgery status: Secondary | ICD-10-CM | POA: Diagnosis not present

## 2023-10-15 DIAGNOSIS — C61 Malignant neoplasm of prostate: Secondary | ICD-10-CM

## 2023-10-15 DIAGNOSIS — G4730 Sleep apnea, unspecified: Secondary | ICD-10-CM | POA: Diagnosis not present

## 2023-10-15 DIAGNOSIS — Z79890 Hormone replacement therapy (postmenopausal): Secondary | ICD-10-CM | POA: Insufficient documentation

## 2023-10-15 DIAGNOSIS — G4733 Obstructive sleep apnea (adult) (pediatric): Secondary | ICD-10-CM

## 2023-10-15 HISTORY — PX: SPACE OAR INSTILLATION: SHX6769

## 2023-10-15 HISTORY — PX: RADIOACTIVE SEED IMPLANT: SHX5150

## 2023-10-15 SURGERY — INSERTION, RADIATION SOURCE, PROSTATE
Anesthesia: General | Site: Rectum

## 2023-10-15 MED ORDER — SODIUM CHLORIDE FLUSH 0.9 % IV SOLN
INTRAVENOUS | Status: DC | PRN
  Administered 2023-10-15: 50 mL

## 2023-10-15 MED ORDER — LIDOCAINE HCL (CARDIAC) PF 100 MG/5ML IV SOSY
PREFILLED_SYRINGE | INTRAVENOUS | Status: DC | PRN
  Administered 2023-10-15: 100 mg via INTRAVENOUS

## 2023-10-15 MED ORDER — OXYCODONE HCL 5 MG PO TABS: 5.0000 mg | ORAL_TABLET | Freq: Once | ORAL | Status: DC | PRN

## 2023-10-15 MED ORDER — CIPROFLOXACIN IN D5W 400 MG/200ML IV SOLN
400.0000 mg | INTRAVENOUS | Status: AC
  Administered 2023-10-15: 400 mg via INTRAVENOUS
  Filled 2023-10-15: qty 200

## 2023-10-15 MED ORDER — FENTANYL CITRATE PF 50 MCG/ML IJ SOSY: 25.0000 ug | PREFILLED_SYRINGE | INTRAMUSCULAR | Status: DC | PRN

## 2023-10-15 MED ORDER — LIDOCAINE HCL (PF) 2 % IJ SOLN
INTRAMUSCULAR | Status: AC
  Filled 2023-10-15: qty 5

## 2023-10-15 MED ORDER — MIDAZOLAM HCL 5 MG/5ML IJ SOLN
INTRAMUSCULAR | Status: DC | PRN
  Administered 2023-10-15: 2 mg via INTRAVENOUS

## 2023-10-15 MED ORDER — PROPOFOL 10 MG/ML IV BOLUS
INTRAVENOUS | Status: DC | PRN
  Administered 2023-10-15: 100 mg via INTRAVENOUS
  Administered 2023-10-15: 200 mg via INTRAVENOUS

## 2023-10-15 MED ORDER — DEXAMETHASONE SODIUM PHOSPHATE 10 MG/ML IJ SOLN
INTRAMUSCULAR | Status: DC | PRN
  Administered 2023-10-15: 4 mg via INTRAVENOUS

## 2023-10-15 MED ORDER — FLEET ENEMA RE ENEM: 1.0000 | ENEMA | Freq: Once | RECTAL | Status: DC

## 2023-10-15 MED ORDER — LACTATED RINGERS IV SOLN: INTRAVENOUS | Status: DC

## 2023-10-15 MED ORDER — FENTANYL CITRATE (PF) 100 MCG/2ML IJ SOLN
INTRAMUSCULAR | Status: AC
  Filled 2023-10-15: qty 2

## 2023-10-15 MED ORDER — CHLORHEXIDINE GLUCONATE 0.12 % MT SOLN
15.0000 mL | Freq: Once | OROMUCOSAL | Status: AC
  Administered 2023-10-15: 15 mL via OROMUCOSAL

## 2023-10-15 MED ORDER — IOPAMIDOL (ISOVUE-300) INJECTION 61%
INTRAVENOUS | Status: DC | PRN
  Administered 2023-10-15: 5 mL

## 2023-10-15 MED ORDER — ROCURONIUM BROMIDE 100 MG/10ML IV SOLN
INTRAVENOUS | Status: DC | PRN
  Administered 2023-10-15: 30 mg via INTRAVENOUS
  Administered 2023-10-15: 10 mg via INTRAVENOUS
  Administered 2023-10-15: 5 mg via INTRAVENOUS

## 2023-10-15 MED ORDER — SUGAMMADEX SODIUM 200 MG/2ML IV SOLN
INTRAVENOUS | Status: DC | PRN
  Administered 2023-10-15: 200 mg via INTRAVENOUS

## 2023-10-15 MED ORDER — MIDAZOLAM HCL 2 MG/2ML IJ SOLN
INTRAMUSCULAR | Status: AC
Start: 2023-10-15 — End: ?
  Filled 2023-10-15: qty 2

## 2023-10-15 MED ORDER — ONDANSETRON HCL 4 MG/2ML IJ SOLN
INTRAMUSCULAR | Status: AC
  Filled 2023-10-15: qty 2

## 2023-10-15 MED ORDER — ORAL CARE MOUTH RINSE: 15.0000 mL | Freq: Once | OROMUCOSAL | Status: AC

## 2023-10-15 MED ORDER — PROPOFOL 10 MG/ML IV BOLUS
INTRAVENOUS | Status: AC
  Filled 2023-10-15: qty 20

## 2023-10-15 MED ORDER — TAMSULOSIN HCL 0.4 MG PO CAPS: 0.4000 mg | ORAL_CAPSULE | Freq: Every day | ORAL | 2 refills | Status: AC

## 2023-10-15 MED ORDER — ONDANSETRON HCL 4 MG/2ML IJ SOLN
INTRAMUSCULAR | Status: DC | PRN
  Administered 2023-10-15: 4 mg via INTRAVENOUS

## 2023-10-15 MED ORDER — TRAMADOL HCL 50 MG PO TABS: 50.0000 mg | ORAL_TABLET | Freq: Four times a day (QID) | ORAL | 0 refills | Status: AC | PRN

## 2023-10-15 MED ORDER — ACETAMINOPHEN 10 MG/ML IV SOLN: 1000.0000 mg | Freq: Once | INTRAVENOUS | Status: DC | PRN

## 2023-10-15 MED ORDER — STERILE WATER FOR IRRIGATION IR SOLN
Status: DC | PRN
  Administered 2023-10-15: 1000 mL

## 2023-10-15 MED ORDER — SODIUM CHLORIDE (PF) 0.9 % IJ SOLN
INTRAMUSCULAR | Status: AC
  Filled 2023-10-15: qty 50

## 2023-10-15 MED ORDER — DEXAMETHASONE SODIUM PHOSPHATE 10 MG/ML IJ SOLN
INTRAMUSCULAR | Status: AC
  Filled 2023-10-15: qty 1

## 2023-10-15 MED ORDER — SUCCINYLCHOLINE CHLORIDE 200 MG/10ML IV SOSY
PREFILLED_SYRINGE | INTRAVENOUS | Status: AC
  Filled 2023-10-15: qty 10

## 2023-10-15 MED ORDER — FENTANYL CITRATE (PF) 100 MCG/2ML IJ SOLN
INTRAMUSCULAR | Status: DC | PRN
  Administered 2023-10-15 (×2): 25 ug via INTRAVENOUS
  Administered 2023-10-15: 50 ug via INTRAVENOUS
  Administered 2023-10-15 (×2): 25 ug via INTRAVENOUS

## 2023-10-15 MED ORDER — SUCCINYLCHOLINE CHLORIDE 200 MG/10ML IV SOSY
PREFILLED_SYRINGE | INTRAVENOUS | Status: DC | PRN
Start: 2023-10-15 — End: 2023-10-15
  Administered 2023-10-15: 180 mg via INTRAVENOUS

## 2023-10-15 MED ORDER — OXYCODONE HCL 5 MG/5ML PO SOLN: 5.0000 mg | Freq: Once | ORAL | Status: DC | PRN

## 2023-10-15 MED ORDER — ROCURONIUM BROMIDE 10 MG/ML (PF) SYRINGE
PREFILLED_SYRINGE | INTRAVENOUS | Status: AC
  Filled 2023-10-15: qty 10

## 2023-10-15 MED ORDER — ACETAMINOPHEN 500 MG PO TABS: 1000.0000 mg | ORAL_TABLET | Freq: Once | ORAL | Status: DC

## 2023-10-15 MED ORDER — DROPERIDOL 2.5 MG/ML IJ SOLN: 0.6250 mg | Freq: Once | INTRAMUSCULAR | Status: DC | PRN

## 2023-10-15 SURGICAL SUPPLY — 32 items
BAG COUNTER SPONGE SURGICOUNT (BAG) IMPLANT
BAG URINE DRAIN 2000ML AR STRL (UROLOGICAL SUPPLIES) ×1 IMPLANT
CATH FOLEY 2WAY SLVR 5CC 16FR (CATHETERS) ×1 IMPLANT
CATH ROBINSON RED A/P 20FR (CATHETERS) ×1 IMPLANT
COVER BACK TABLE 60X90IN (DRAPES) ×1 IMPLANT
COVER MAYO STAND STRL (DRAPES) ×1 IMPLANT
DRSG TEGADERM 4X4.75 (GAUZE/BANDAGES/DRESSINGS) ×2 IMPLANT
DRSG TEGADERM 8X12 (GAUZE/BANDAGES/DRESSINGS) ×2 IMPLANT
GLOVE BIO SURGEON STRL SZ7.5 (GLOVE) ×1 IMPLANT
GLOVE ECLIPSE 8.0 STRL XLNG CF (GLOVE) ×1 IMPLANT
GLOVE SURG LX STRL 7.5 STRW (GLOVE) ×2 IMPLANT
GOWN STRL REUS W/ TWL LRG LVL3 (GOWN DISPOSABLE) ×2 IMPLANT
GRID BRACH TEMP 18GA 2.8X3X.75 (MISCELLANEOUS) ×1 IMPLANT
HOLDER FOLEY CATH W/STRAP (MISCELLANEOUS) ×1 IMPLANT
IMPL SPACEOAR SYSTEM 10ML (Spacer) ×1 IMPLANT
KIT TURNOVER KIT A (KITS) IMPLANT
MARKER GOLD PRELOAD 1.2X3 (Urological Implant) IMPLANT
MARKER SKIN DUAL TIP RULER LAB (MISCELLANEOUS) ×1 IMPLANT
NDL BRACHY 18G 5PK (NEEDLE) ×4 IMPLANT
NDL BRACHY 18G SINGLE (NEEDLE) IMPLANT
NDL PK MORGANSTERN STABILIZ (NEEDLE) ×1 IMPLANT
NEEDLE BRACHY 18G 5PK (NEEDLE) ×4 IMPLANT
NEEDLE BRACHY 18G SINGLE (NEEDLE) IMPLANT
NEEDLE PK MORGANSTERN STABILIZ (NEEDLE) ×1 IMPLANT
PACK CYSTO (CUSTOM PROCEDURE TRAY) ×1 IMPLANT
PENCIL SMOKE EVACUATOR (MISCELLANEOUS) IMPLANT
QuickLink Cartridges with BrachySource I-125 Seeds IMPLANT
SURGILUBE 2OZ TUBE FLIPTOP (MISCELLANEOUS) ×1 IMPLANT
SYR 10ML LL (SYRINGE) ×1 IMPLANT
TOWEL OR 17X26 10 PK STRL BLUE (TOWEL DISPOSABLE) ×1 IMPLANT
TRAY FOLEY MTR SLVR 16FR STAT (SET/KITS/TRAYS/PACK) ×1 IMPLANT
UNDERPAD 30X36 HEAVY ABSORB (UNDERPADS AND DIAPERS) ×3 IMPLANT

## 2023-10-15 NOTE — Transfer of Care (Signed)
 Immediate Anesthesia Transfer of Care Note  Patient: [facility] Dalton  Procedure(s) Performed: INSERTION, RADIATION SOURCE, PROSTATE (Rectum) INJECTION, HYDROGEL SPACER (Rectum)  Patient Location: PACU  Anesthesia Type:General  Level of Consciousness: awake, alert , oriented, and patient cooperative  Airway & Oxygen Therapy: Patient Spontanous Breathing and Patient connected to face mask oxygen  Post-op Assessment: Report given to RN and Post -op Vital signs reviewed and stable  Post vital signs: Reviewed and stable  Last Vitals:  Vitals Value Taken Time  BP 139/70 10/15/23 1215  Temp 35.8 C 10/15/23 1210  Pulse 84 10/15/23 1216  Resp 0 10/15/23 1216  SpO2 100 % 10/15/23 1216  Vitals shown include unfiled device data.  Last Pain:  Vitals:   10/15/23 1210  TempSrc:   PainSc: Asleep         Complications: No notable events documented.

## 2023-10-15 NOTE — H&P (Signed)
 Pt presents today for pre-operative history and physical exam in anticipation of brachytherapy and space oar placement by Dr. Dulcy Gibney on 10/15/23. He is doing well and is without complaint.   Pt denies F/C, HA, CP, SOB, N/V, diarrhea/constipation, back pain, flank pain, hematuria, and dysuria.     HX:   CC: Prostate cancer   Patient presents today for discussion of his prostate cancer. He underwent a prostate biopsy 2 weeks prior.   Prostate cancer profile  Stage: T1c  PSA: 5.74  Biopsy 05/25/2023, 6/12 cores positive: 4 cores 3+4 = 7 in the right apex and mid medial/lateral. Left medial base  Prostate volume: 45 g   Prostate cancer nomogram after radical prostatectomy:  15-year cancer specific survival 98%  Progression free survival at 5 years 83% at 10 years 72%  Lymph node involvement 4%   The patient has minimal voiding symptoms, takes no medication for it.  SH IM: 23   The patient was on testosterone  replacement therapy and noted to have a rising PSA. It was stopped, his PSA continued to rise. He is no longer taking testosterone  replacement.   The patient has no surgical history     ALLERGIES: No Allergies    MEDICATIONS: Depo-Testosterone  100 MG/ML Solution 1.25 ml IM Q1WK  Depo-Testosterone  200 MG/ML Solution 0.6 ml IM Q1WK  Sildenafil Citrate 20 MG Tablet 1-5 tablets as needed for intimacy  BD Syringe/Needle 22G X 1-1/2" 3 ML Miscellaneous 0 Does Not Apply  Hypodermic Needle 18G X 1-1/2" Miscellaneous 0 Does Not Apply  Multi-Vitamin Tablet Oral     GU PSH: Prostate Needle Biopsy - 05/26/2023       PSH Notes: Gastric sleeve  liposuction extremities  umbilical hernia repair   NON-GU PSH: Surgical Pathology, Gross And Microscopic Examination For Prostate Needle - 05/26/2023 Visit Complexity (formerly GPC1X) - 03/24/2023     GU PMH: Prostate Cancer - 06/01/2023 Elevated PSA - 05/26/2023, - 03/24/2023, - 12/29/2022 Primary hypogonadism - 03/24/2023, - 12/29/2022, -  2023, - 2021, - 2020, - 2020, - 2019, - 2019, - 2019, - 2018, - 2018, - 2018, - 2017, Hypogonadism, testicular, - 2017 Undescend testicle, Undescended testicle - 2015 Encounter for Prostate Cancer screening, Prostate cancer screening - 2015    NON-GU PMH: Encounter for general adult medical examination without abnormal findings, Encounter for preventive health examination - 2017 Personal history of other diseases of the nervous system and sense organs, History of sleep apnea - 2015    FAMILY HISTORY: No pertinent family history - Runs In Family   SOCIAL HISTORY: Marital Status: Married Preferred Language: English; Ethnicity: Not Hispanic Or Latino; Race: Black or African American Current Smoking Status: Patient smokes occasionally.   Tobacco Use Assessment Completed: Used Tobacco in last 30 days? Does not use smokeless tobacco. Does drink.  Does not use drugs. Does not drink caffeine. Has not had a blood transfusion. 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     Notes: Former smoker, Mother alive and healthy, Alcohol use, Father alive and healthy, Occupation, Married, Caffeine use   smokes cigars a couple of times per week  wine or liquor 1 drink 3-4 times per week    REVIEW OF SYSTEMS:    GU Review Male:   Patient denies frequent urination, hard to postpone urination, burning/ pain with urination, get up at night to urinate, leakage of urine, stream starts and stops, trouble starting your stream, have to strain to urinate , erection problems, and penile pain.  Gastrointestinal (Upper):  Patient denies nausea, vomiting, and indigestion/ heartburn.  Gastrointestinal (Lower):   Patient denies diarrhea and constipation.  Constitutional:   Patient denies night sweats, weight loss, fever, and fatigue.  Skin:   Patient denies skin rash/ lesion and itching.  Eyes:   Patient denies blurred vision and double vision.  Ears/ Nose/ Throat:   Patient denies sore throat and sinus problems.   Hematologic/Lymphatic:   Patient denies swollen glands and easy bruising.  Cardiovascular:   Patient denies leg swelling and chest pains.  Respiratory:   Patient denies cough and shortness of breath.  Endocrine:   Patient denies excessive thirst.  Musculoskeletal:   Patient denies back pain and joint pain.  Neurological:   Patient denies headaches and dizziness.  Psychologic:   Patient denies depression and anxiety.   VITAL SIGNS:      10/06/2023 01:00 PM  Weight 343 lb / 155.58 kg  Height 72 in / 182.88 cm  BP 129/84 mmHg  Pulse 84 /min  Temperature 98.1 F / 36.7 C  BMI 46.5 kg/m   MULTI-SYSTEM PHYSICAL EXAMINATION:    Constitutional: Well-nourished. No physical deformities. Normally developed. Good grooming.  Neck: Neck symmetrical, not swollen. Normal tracheal position.  Respiratory: Normal breath sounds. No labored breathing, no use of accessory muscles.   Cardiovascular: Regular rate and rhythm. No murmur, no gallop  Lymphatic: No enlargement of neck, axillae, groin.  Skin: No paleness, no jaundice, no cyanosis. No lesion, no ulcer, no rash.  Neurologic / Psychiatric: Oriented to time, oriented to place, oriented to person. No depression, no anxiety, no agitation.  Gastrointestinal: No mass, no tenderness, no rigidity, obese abdomen.   Eyes: Normal conjunctivae. Normal eyelids.  Ears, Nose, Mouth, and Throat: Left ear no scars, no lesions, no masses. Right ear no scars, no lesions, no masses. Nose no scars, no lesions, no masses. Normal hearing. Normal lips.  Musculoskeletal: Normal gait and station of head and neck.     Complexity of Data:  Records Review:   Previous Patient Records  Urine Test Review:   Urinalysis   10/06/23  Urinalysis  Urine Appearance Clear   Urine Color Yellow   Urine Glucose Neg mg/dL  Urine Bilirubin Neg mg/dL  Urine Ketones Neg mg/dL  Urine Specific Gravity 1.020   Urine Blood Trace ery/uL  Urine pH 6.0   Urine Protein Neg mg/dL  Urine  Urobilinogen 0.2 mg/dL  Urine Nitrites Neg   Urine Leukocyte Esterase Neg leu/uL  Urine WBC/hpf NS (Not Seen)   Urine RBC/hpf 0 - 2/hpf   Urine Epithelial Cells NS (Not Seen)   Urine Bacteria NS (Not Seen)   Urine Mucous Not Present   Urine Yeast NS (Not Seen)   Urine Trichomonas Not Present   Urine Cystals NS (Not Seen)   Urine Casts NS (Not Seen)   Urine Sperm Not Present    PROCEDURES:          Urinalysis w/Scope - 81001 Dipstick Dipstick Cont'd Micro  Color: Yellow Bilirubin: Neg mg/dL WBC/hpf: NS (Not Seen)  Appearance: Clear Ketones: Neg mg/dL RBC/hpf: 0 - 2/hpf  Specific Gravity: 1.020 Blood: Trace ery/uL Bacteria: NS (Not Seen)  pH: 6.0 Protein: Neg mg/dL Cystals: NS (Not Seen)  Glucose: Neg mg/dL Urobilinogen: 0.2 mg/dL Casts: NS (Not Seen)    Nitrites: Neg Trichomonas: Not Present    Leukocyte Esterase: Neg leu/uL Mucous: Not Present      Epithelial Cells: NS (Not Seen)      Yeast: NS (Not Seen)  Sperm: Not Present    ASSESSMENT:      ICD-10 Details  1 GU:   Prostate Cancer - C61    PLAN:           Schedule Return Visit/Planned Activity: Keep Scheduled Appointment - Schedule Surgery          Document Letter(s):  Created for Patient: Clinical Summary         Notes:   There are no changes in the patients history or physical exam since last evaluation by Dr. Dulcy Gibney. Pt is scheduled to undergo brachytherapy and space oar on 10/15/23.

## 2023-10-15 NOTE — Anesthesia Postprocedure Evaluation (Signed)
 Anesthesia Post Note  Patient: [facility] Dalton  Procedure(s) Performed: INSERTION, RADIATION SOURCE, PROSTATE (Rectum) INJECTION, HYDROGEL SPACER (Rectum)     Patient location during evaluation: PACU Anesthesia Type: General Level of consciousness: awake and alert Pain management: pain level controlled Vital Signs Assessment: post-procedure vital signs reviewed and stable Respiratory status: spontaneous breathing, nonlabored ventilation, respiratory function stable and patient connected to nasal cannula oxygen Cardiovascular status: blood pressure returned to baseline and stable Postop Assessment: no apparent nausea or vomiting Anesthetic complications: no   No notable events documented.  Last Vitals:  Vitals:   10/15/23 1330 10/15/23 1411  BP: (!) 148/94 (!) 156/97  Pulse: 75 77  Resp:  18  Temp:  36.6 C  SpO2: 100% 100%    Last Pain:  Vitals:   10/15/23 1411  TempSrc:   PainSc: 0-No pain                 Lethaniel Rave

## 2023-10-15 NOTE — Anesthesia Procedure Notes (Signed)
 Procedure Name: LMA Insertion Date/Time: 10/15/2023 10:22 AM  Performed by: Mervyn Ace, CRNAPre-anesthesia Checklist: Patient identified, Emergency Drugs available, Suction available, Patient being monitored and Timeout performed Patient Re-evaluated:Patient Re-evaluated prior to induction Oxygen Delivery Method: Circle system utilized Preoxygenation: Pre-oxygenation with 100% oxygen Induction Type: IV induction Ventilation: Mask ventilation without difficulty LMA: LMA with gastric port inserted LMA Size: 5.0 Number of attempts: 1 Placement Confirmation: positive ETCO2 and CO2 detector Tube secured with: Tape (secured with pink Hy-tape) Dental Injury: Teeth and Oropharynx as per pre-operative assessment

## 2023-10-15 NOTE — Anesthesia Preprocedure Evaluation (Signed)
 Anesthesia Evaluation  Patient identified by MRN, date of birth, ID band Patient awake    Reviewed: Allergy & Precautions, NPO status , Patient's Chart, lab work & pertinent test results  History of Anesthesia Complications Negative for: history of anesthetic complications  Airway Mallampati: III  TM Distance: >3 FB Neck ROM: Full    Dental no notable dental hx. (+) Dental Advisory Given   Pulmonary sleep apnea and Continuous Positive Airway Pressure Ventilation , Current Smoker and Patient abstained from smoking.   Pulmonary exam normal breath sounds clear to auscultation       Cardiovascular negative cardio ROS Normal cardiovascular exam Rhythm:Regular Rate:Normal     Neuro/Psych negative neurological ROS  negative psych ROS   GI/Hepatic Neg liver ROS,,,S/p gastric sleeve   Endo/Other    Class 3 obesity  Renal/GU    Prostate cancer    Musculoskeletal   Abdominal  (+) + obese  Peds  Hematology   Anesthesia Other Findings   Reproductive/Obstetrics                              Anesthesia Physical Anesthesia Plan  ASA: 3  Anesthesia Plan: General   Post-op Pain Management:    Induction: Intravenous  PONV Risk Score and Plan: 1 and Ondansetron , Dexamethasone  and Treatment may vary due to age or medical condition  Airway Management Planned: LMA  Additional Equipment:   Intra-op Plan:   Post-operative Plan: Extubation in OR  Informed Consent: I have reviewed the patients History and Physical, chart, labs and discussed the procedure including the risks, benefits and alternatives for the proposed anesthesia with the patient or authorized representative who has indicated his/her understanding and acceptance.     Dental advisory given  Plan Discussed with: CRNA, Anesthesiologist and Surgeon  Anesthesia Plan Comments:          Anesthesia Quick Evaluation

## 2023-10-15 NOTE — Anesthesia Procedure Notes (Signed)
 Procedure Name: Intubation Date/Time: 10/15/2023 10:53 AM  Performed by: Mervyn Ace, CRNAPre-anesthesia Checklist: Patient identified, Emergency Drugs available, Suction available, Patient being monitored and Timeout performed Patient Re-evaluated:Patient Re-evaluated prior to induction Oxygen Delivery Method: Circle system utilized Preoxygenation: Pre-oxygenation with 100% oxygen Induction Type: IV induction Ventilation: Mask ventilation without difficulty Laryngoscope Size: Mac and 4 Grade View: Grade I Tube type: Oral Tube size: 8.0 mm Number of attempts: 1 Airway Equipment and Method: Stylet Placement Confirmation: ETT inserted through vocal cords under direct vision, positive ETCO2, CO2 detector and breath sounds checked- equal and bilateral Secured at: 21 cm Tube secured with: Tape Dental Injury: Teeth and Oropharynx as per pre-operative assessment

## 2023-10-15 NOTE — Discharge Instructions (Signed)
 DISCHARGE INSTRUCTIONS FOR PROSTATE SEED IMPLANTATION  Activity:    Rest for the remainder of the day.  Do not drive or operate equipment today.  You may resume normal  activities in a few days as instructed by your physician, without risk of harmful radiation exposure to those around you, provided you follow the time and distance precautions on the Radiation Oncology Instruction Sheet.   Meals: Drink plenty of lipuids and eat light foods, such as gelatin or soup this evening .  You may return to normal meal plan tomorrow.  Return To Work: You may return to work as instructed by Designer, multimedia.  Special Instruction:   If any seeds are found, use tweezers to pick up seeds and place in a glass container of any kind and bring to your physician's office.  Call your physician if any of these symptoms occur:  Persistent or heavy bleeding Urine stream diminishes or stops completely after catheter is removed Fever equal to or greater than 101 degrees F Cloudy urine with a strong foul odor Severe pain  You may feel some burning pain and/or hesitancy when you urinate after the catheter is removed.  These symptoms may increase over the next few weeks, but should diminish within forur to six weeks.  Applying moist heat to the lower abdomen or a hot tub bath may help relieve the pain.  If the discomfort becomes severe, please call your physician for additional medications.    PROSTATE CANCER TREATMENT WITH RADIOACTIVE IODINE-125 SEED IMPLANT  This instruction sheet is intended to discuss implantation of Iodine-125 seeds as treatment for cancer of the prostate. It will explain in detail what you may expect from this treatment and what precautions are necessary as a result of the treatment. Iodine-125 emits a relatively low energy radiation. The radioactive seeds are surgically implanted directly into the prostate gland. Most of the radiation is contained within the prostate gland. A very small amount  is present outside the body.The precautions that we ask you to take are to ensure that those around you are protected from unnecessary radiation. The principles of radiation safety that you need to understand are:  DISTANCE: The further a person is from the radioactive implant the less radiation they will be receiving. The amount of radiation received falls off quite rapidly with distance. More specific guidelines are given in the table on the last page.  TIME: The amount of radiation a person is exposed to is directly proportional to the amount of time that is spent in close proximity to the radioactive implant. Very little radiation will be received during short periods. See the table on the last page for more specific guideline.  CHILDREN UNDER AGE 35 Children should not be allowed to sit on your lap or otherwise be in very close contact for more than a few minutes for the first 6-8 weeks following the implant. You may affectionately greet (hug/kiss) a child for a short period of time, but remember, the longer you are in close proximity with that child the more radiation they are being exposed to. At a distance of 6 feet there is no limit to the length of time you may spend together. See specific guidelines on the last page.  PREGNANT OR POSSIBLY PREGNANT WOMEN Pregnant women should avoid prolonged close physical contact with you for the first 6-8 weeks after implant. At a distance of 6 feet there is no limit to the length of time you may spend together. Pregnant women or possibly  pregnant women can safely be in close contact with you for a limited period of time. See the last page for guidelines.  FAMILY RELATIONS You may sleep in the same bed as your partner (provided she is not pregnant or under the age of 75). Sexual intercourse, using a condom, may be resumed 2 weeks after the implant. Your semen may be discolored, dark brown or black. This is normal and is the result of bleeding that may have  occurred during the implant. After 3-4 weeks it will not be necessary to use a condom.  DAILY ACTIVITIES You may resume normal activities in a few days (example: work, shopping, church) without the risk of harmful radiation exposure to those around you provided you keep in mind the time and distance precautions. Objects that you touch or item that you use do not become radioactive. Linens, clothing, tableware, and dishes may be used by other persons without special precautions. Your bodily wastes (urine and stool) are not radioactive.  SPECIAL PRECAUTIONS It is possible to lose implanted Iodine-125 seed(s) through urination. Although it is possible to pass seeds indefinitely, it is most likely to occur immediately after catheter removal. To prevent this from happening the catheter that was in place during the implant procedure is removed immediately after the implant and a cystoscopy procedure is performed. The process of removing the catheter and the cystoscopy procedure should dislodge and remove any seeds that are not firmly imbedded in the prostate tissue. However, you should watch for seeds if/when you remove your catheter at home. The seeds are silver colored and the size of a grain of rice. In the unlikely event that a seed is seen after urination, simply flush the seed down the toilet. The seed should not be handled with your fingers, not even with a glove or napkin. A spoon or tweezers can be used to pick up a seed. The Radiation Oncology department is open Monday - Friday from 8:00 am to 5:30 pm with a Radiation Oncologist on call at all times. He or she may be reached by calling [phone]. If you are to be hospitalized or if death should occur, your family should notify the Actor.  SIDE EFFECTS There are very few side effects associate with the implant procedure. Minor burning with urination, weak stream, hesitancy, intermittency, frequency, mild pain or feeling unable to  pass your urine freely are common and usually stop in one to four months. If these symptoms are extremely uncomfortable, contact your physician.  RADIATION SAFETY GUIDELINES PROSTATE CANCER TREATMENT WITH RADIOACTIVE IODINE-125 SEED IMPLANT  The following guidelines will limit exposure to less than naturally occurring background radiation.  PERSONS AGE 77-45 (if able to become pregnant)  FOR 8 WEEKS FOLLOWING IMPLANT  At a distance of 1 foot: limit time to less than 2 hours/week At a distance of 3 feet: limit time to 20 hours/week At a distance of 6 feet: no restrictions  AFTER 8 WEEKS No restrictions  CHILDREN UNDER AGE 77, PREGNANT WOMEN OR POSSIBLY PREGNANT WOMEN  FOR 8 WEEKS FOLLOWING IMPLANT At a distance of 1 foot: limit time to 10 minutes/week At a distance of 3 feet: limit time to 2 hours/week At a distance of 6 feet: no restrictions  AFTER 8 WEEKS No restrictions  PERSONS OVER THE AGE OF 45 AND DO NOT EXPECT TO HAVE ANY MORE CHILDREN No restrictions  Updated by SCP in January 2020

## 2023-10-15 NOTE — Progress Notes (Signed)
  Radiation Oncology         [phone] ________________________________  Name: Lawrence Dalton MRN: [id number]  Date: 10/15/2023  DOB: 1970-11-30       Prostate Seed Implant  QM:VHQIONG, Dibas, MD  No ref. provider found  DIAGNOSIS: 53 y.o. gentleman with Stage T1c adenocarcinoma of the prostate with Gleason score of 3+4, and PSA of 5.74.   Oncology History  Malignant neoplasm of prostate (HCC)  05/26/2023 Cancer Staging   Staging form: Prostate, AJCC 8th Edition - Clinical stage from 05/26/2023: Stage IIB (cT1c, cN0, cM0, PSA: 5.7, Grade Group: 2) - Signed by Keitha Pata, PA-C on 08/28/2023 Histopathologic type: Adenocarcinoma, NOS Stage prefix: Initial diagnosis Prostate specific antigen (PSA) range: Less than 10 Gleason primary pattern: 3 Gleason secondary pattern: 4 Gleason score: 7 Histologic grading system: 5 grade system Number of biopsy cores examined: 12 Number of biopsy cores positive: 6 Location of positive needle core biopsies: Both sides   08/28/2023 Initial Diagnosis   Malignant neoplasm of prostate (HCC)     No diagnosis found.  PROCEDURE: Insertion of radioactive I-125 seeds into the prostate gland.  RADIATION DOSE: 145 Gy, definitive therapy.  TECHNIQUE: Lawrence Dalton was brought to the operating room with the urologist. He was placed in the dorsolithotomy position. He was catheterized and a rectal tube was inserted. The perineum was shaved, prepped and draped. The ultrasound probe was then introduced by me into the rectum to see the prostate gland.  TREATMENT DEVICE: I attached the needle grid to the ultrasound probe stand and anchor needles were placed.  3D PLANNING: The prostate was imaged in 3D using a sagittal sweep of the prostate probe. These images were transferred to the planning computer. There, the prostate, urethra and rectum were defined on each axial reconstructed image. Then, the software created an optimized 3D plan and a few seed positions were  adjusted. The quality of the plan was reviewed using [facility] information for the target and the following two organs at risk:  Urethra and Rectum.  Then the accepted plan was printed and handed off to the radiation therapist.  Under my supervision, the custom loading of the seeds and spacers was carried out using the quick loader.  These pre-loaded needles were then placed into the needle holder.Aaron Aas  PROSTATE VOLUME STUDY:  Using transrectal ultrasound the volume of the prostate was verified to be 32 cc.  SPECIAL TREATMENT PROCEDURE/SUPERVISION AND HANDLING: The pre-loaded needles were then delivered by the urologist under sagittal guidance. A total of 18 needles were used to deposit 48 seeds in the prostate gland. The individual seed activity was 0.512 mCi.  SpaceOAR:  Yes  COMPLEX SIMULATION: At the end of the procedure, an anterior radiograph of the pelvis was obtained to document seed positioning and count. Cystoscopy was performed by the urologist to check the urethra and bladder.  MICRODOSIMETRY: At the end of the procedure, the patient was emitting 0.056 mR/hr at 1 meter. Accordingly, he was considered safe for hospital discharge.  PLAN: The patient will return to the radiation oncology clinic for post implant CT dosimetry in three weeks.   ________________________________  Trilby Fujisawa Lorri Rota, M.D.

## 2023-10-15 NOTE — Op Note (Signed)
 Preoperative diagnosis:  Clinical stage TI C adenocarcinoma the prostate  Postoperative diagnosis:  Same  Procedure:  #1 I-125 prostate seed implantation  #2 cystourethroscopy #3 instillation of SpaceOAR biogel  Surgeon: Salli Crawley, M.D. Radiation Oncologist: Kenith Payer, M.D.  Anesthesia: Gen.   Indications: Patient  was diagnosed with clinical stage TI C prostate cancer. We had extensive discussion with him about treatment options versus. He elected to proceed with seed implantation. He underwent consultation my office as well as with Dr. Kenith Payer. He appeared to understand the advantages disadvantages potential risks of this treatment option. Full informed consent has been obtained. The patient is had preoperative ciprofloxacin . PAS compression boots were placed.   Technique and findings: Patient was brought the operating room where he had  successful induction of general anesthesia. He was placed in lithotomy position and prepped and draped in usual manner. Appropriate surgical timeout was performed. Radiation oncology department placed a transrectal ultrasound probe anchoring stand. Foley catheter with contrast in the balloon was inserted without difficulty. Anchoring needles were placed within the prostate.  Real-time contouring of the urethra prostate and rectum were performed and the dosing parameters were established. Targeted dose was 145 gray. We then came to the operating suite suite for placement of the needles. A second timeout was performed. All needle passage was done with real-time transrectal ultrasound guidance with the sagittal plane. A total of 18 needles were placed.  48 active seeds were implanted.  The brachytherapy template was then removed.    A site in the midline was selected on the perineum for placement of an 18 g needle with saline.  The needle was advanced above the rectum and below Denonvillier's fascia to the mid gland and confirmed to be in the  midline on transverse imaging.  One cc of saline was injected confirming appropriate expansion of this space.  A total of 5 cc of saline was then injected to open the space further bilaterally.  The saline syringe was then removed and the SpaceOAR hydrogel was injected with good distribution bilaterally.A Foley catheter was removed and flexible cystoscopy failed to show any seeds outside the prostate.  The patient was brought to recovery room in stable condition.

## 2023-10-15 NOTE — Interval H&P Note (Signed)
 History and Physical Interval Note:  10/15/2023 9:29 AM  Lawrence Dalton  has presented today for surgery, with the diagnosis of PROSTATE CANCER.  The various methods of treatment have been discussed with the patient and family. After consideration of risks, benefits and other options for treatment, the patient has consented to  Procedure(s) with comments: INSERTION, RADIATION SOURCE, PROSTATE (N/A) - RADIOACTIVE SEED IMPLANTS INJECTION, HYDROGEL SPACER (N/A) - SPACE OAR as a surgical intervention.  The patient's history has been reviewed, patient examined, no change in status, stable for surgery.  I have reviewed the patient's chart and labs.  Questions were answered to the patient's satisfaction.     Lawrence Dalton

## 2023-10-15 NOTE — Interval H&P Note (Signed)
 History and Physical Interval Note:  10/15/2023 9:30 AM  Lawrence Dalton  has presented today for surgery, with the diagnosis of PROSTATE CANCER.  The various methods of treatment have been discussed with the patient and family. After consideration of risks, benefits and other options for treatment, the patient has consented to  Procedure(s) with comments: INSERTION, RADIATION SOURCE, PROSTATE (N/A) - RADIOACTIVE SEED IMPLANTS INJECTION, HYDROGEL SPACER (N/A) - SPACE OAR as a surgical intervention.  The patient's history has been reviewed, patient examined, no change in status, stable for surgery.  I have reviewed the patient's chart and labs.  Questions were answered to the patient's satisfaction.     [name]

## 2023-10-19 NOTE — Progress Notes (Signed)
 Patient was a RadOnc Consult on 08/28/23 for his stage T1c adenocarcinoma of the prostate with Gleason score of 3+4, and PSA of 5.74.  Patient proceed with treatment recommendations of brachytherapy and had his treatment on 10/15/23.   Patient is scheduled for a post treatment CT Simulation on 5/15 and follow up with urology on 5/15 as well.

## 2023-10-21 ENCOUNTER — Other Ambulatory Visit: Payer: Self-pay | Admitting: Urology

## 2023-10-21 DIAGNOSIS — C61 Malignant neoplasm of prostate: Secondary | ICD-10-CM

## 2023-10-30 ENCOUNTER — Telehealth: Payer: Self-pay | Admitting: *Deleted

## 2023-10-30 NOTE — Telephone Encounter (Signed)
 Called patient to remind of MRI for 10-31-23- arrival time- 10:15 am @ [facility], no restrictions to scan, spoke with patient and he is aware of this MRI

## 2023-10-31 ENCOUNTER — Ambulatory Visit (HOSPITAL_COMMUNITY)
Admission: RE | Admit: 2023-10-31 | Discharge: 2023-10-31 | Disposition: A | Discharge status: Home / Self Care | Source: Ambulatory Visit | Attending: Urology | Admitting: Urology

## 2023-10-31 DIAGNOSIS — C61 Malignant neoplasm of prostate: Secondary | ICD-10-CM | POA: Insufficient documentation

## 2023-11-03 NOTE — Progress Notes (Signed)
 Post-seed nursing interview for a diagnosis of Stage T1c adenocarcinoma of the prostate with Gleason score of 3+4, and PSA of 5.74.   Patient identity verified x2.   Patient states issues as follows...  -Pain: Denies -Fatigue: Denies -Abdomen: Denies -Urinary: Push to start urine stream -Bowels: Denies  Patient denies all other related issues at this time.  Meaningful use complete.  I-PSS (AUA) score- 12 - Moderate SHIM (ED) score- 20- Sexually active Urinary Management medication(s) Tamsulosin  Urology appointment date- 11/05/2023 with Dr. Dulcy Gibney at [facility]  Vitals- BP (!) 151/97 (BP Location: Left Arm, Patient Position: Sitting, Cuff Size: Normal)   Pulse 85   Temp 97.8 F (36.6 C) (Temporal)   Resp 18   Ht 6' (1.829 m)   Wt (!) 350 lb (158.8 kg)   SpO2 100%   BMI 47.47 kg/m   This concludes the interaction.   Avery Bodo, LPN

## 2023-11-04 ENCOUNTER — Telehealth: Payer: Self-pay | Admitting: *Deleted

## 2023-11-04 NOTE — Progress Notes (Signed)
 Radiation Oncology         [phone]) [phone] ________________________________  Name: Lawrence Dalton MRN: [id number]  Date: 11/05/2023  DOB: [birth year]  Post-Seed Follow-Up Visit Note  CC: Lawrence Pinal, MD  Lawrence Banker, MD  Diagnosis:   53 y.o. gentleman with Stage T1c adenocarcinoma of the prostate with Gleason score of 3+4, and PSA of 5.74.     ICD-10-CM   1. Malignant neoplasm of prostate (HCC)  C61       Interval Since Last Radiation:  3 weeks 10/15/23:  Insertion of radioactive I-125 seeds into the prostate gland; 145 Gy, definitive therapy with placement of SpaceOAR gel.  Narrative:  The patient returns today for routine follow-up.  He is complaining of increased urinary frequency and urinary hesitation symptoms. He filled out a questionnaire regarding urinary function today providing and overall IPSS score of 12 characterizing his symptoms as moderate and improving. He is taking Flomax  daily as prescribed and reports that his LUTS are tolerable. He specifically denies dysuria, gross hematuria, flank pain, fever or chills. His pre-implant score was 2. He denies any abdominal pain or bowel symptoms. His energy level remains high and overall, he is very pleased with his progress to date.  ALLERGIES:  is allergic to other.  Meds: Current Outpatient Medications  Medication Sig Dispense Refill   Calcium Carb-Cholecalciferol (CALCIUM 600/VITAMIN D3 PO) Take 1 tablet by mouth daily.     Multiple Vitamin (MULTIVITAMIN WITH MINERALS) TABS tablet Take 1 tablet by mouth daily. Bariatric Multivitamin     sildenafil (REVATIO) 20 MG tablet Take 20 mg by mouth as needed.     tamsulosin  (FLOMAX ) 0.4 MG CAPS capsule Take 1 capsule (0.4 mg total) by mouth daily. 30 capsule 2   traMADol  (ULTRAM ) 50 MG tablet Take 1-2 tablets (50-100 mg total) by mouth every 6 (six) hours as needed for moderate pain (pain score 4-6). 15 tablet 0   No current facility-administered medications for this encounter.     Physical Findings: In general this is a well appearing African American male in no acute distress. He's alert and oriented x4 and appropriate throughout the examination. Cardiopulmonary assessment is negative for acute distress and he exhibits normal effort.   Lab Findings: Lab Results  Component Value Date   WBC 5.7 10/05/2023   HGB 13.6 10/05/2023   HCT 42.4 10/05/2023   MCV 91.6 10/05/2023   PLT 211 10/05/2023    Radiographic Findings:  Patient underwent CT imaging in our clinic for post implant dosimetry. The CT will be fused with his prostate MRI that was performed 10/31/23 and will be reviewed by Dr. Lorri Dalton to confirm there is an adequate distribution of radioactive seeds throughout the prostate gland and ensure that there are no seeds in or near the rectum. We suspect the final radiation plan and dosimetry will show appropriate coverage of the prostate gland. He understands that we will call and inform him of any unexpected findings on further review of his imaging and dosimetry.  Impression/Plan: The patient is recovering from the effects of radiation. His urinary symptoms should gradually improve over the next 4-6 months. We talked about this today. He is encouraged by his improvement already and is otherwise pleased with his outcome. We also talked about long-term follow-up for prostate cancer following seed implant. He understands that ongoing PSA determinations and digital rectal exams will help perform surveillance to rule out disease recurrence. He has a follow up appointment scheduled with Lawrence Harsh, NP on 11/05/23 and  then will see Dr. Dulcy Dalton in 12/2023, following his initial post-treatment PSA. He understands what to expect with his PSA measures. Patient was also educated today about some of the long-term effects from radiation including a small risk for rectal bleeding and possibly erectile dysfunction. We talked about some of the general management approaches to these  potential complications. However, I did encourage the patient to contact our office or return at any point if he has questions or concerns related to his previous radiation and prostate cancer.    Arta Bihari, PA-C

## 2023-11-04 NOTE — Telephone Encounter (Signed)
 CALLED PATIENT TO REMIND OF POST SEED APPTS. FOR 11-05-23, LVM FOR A RETURN CALL

## 2023-11-04 NOTE — Progress Notes (Signed)
  Radiation Oncology         [phone] ________________________________  Name: Lawrence Dalton MRN: [id number]  Date: 11/05/2023  DOB: 02-16-1971  COMPLEX SIMULATION NOTE  NARRATIVE:  The patient was brought to the CT Simulation planning suite today following prostate seed implantation approximately one month ago.  Identity was confirmed.  All relevant records and images related to the planned course of therapy were reviewed.  Then, the patient was set-up supine.  CT images were obtained.  The CT images were loaded into the planning software.  Then the prostate and rectum were contoured.  Treatment planning then occurred.  The implanted iodine 125 seeds were identified by the physics staff for projection of radiation distribution  I have requested : 3D Simulation  I have requested a DVH of the following structures: Prostate and rectum.    ________________________________  Trilby Fujisawa Lorri Rota, M.D.

## 2023-11-05 ENCOUNTER — Ambulatory Visit
Admission: RE | Admit: 2023-11-05 | Discharge: 2023-11-05 | Disposition: A | Discharge status: Home / Self Care | Payer: Self-pay | Source: Ambulatory Visit | Attending: Urology | Admitting: Urology

## 2023-11-05 ENCOUNTER — Ambulatory Visit
Admission: RE | Admit: 2023-11-05 | Discharge: 2023-11-05 | Disposition: A | Discharge status: Home / Self Care | Payer: Self-pay | Source: Ambulatory Visit | Attending: Radiation Oncology | Admitting: Radiation Oncology

## 2023-11-05 ENCOUNTER — Encounter: Payer: Self-pay | Admitting: Urology

## 2023-11-05 VITALS — BP 151/97 | HR 85 | Temp 97.8°F | Resp 18 | Ht 72.0 in | Wt 350.0 lb

## 2023-11-05 DIAGNOSIS — C61 Malignant neoplasm of prostate: Secondary | ICD-10-CM | POA: Diagnosis present

## 2023-11-05 DIAGNOSIS — Z923 Personal history of irradiation: Secondary | ICD-10-CM | POA: Diagnosis not present

## 2023-11-13 ENCOUNTER — Ambulatory Visit
Admission: RE | Admit: 2023-11-13 | Discharge: 2023-11-13 | Disposition: A | Discharge status: Home / Self Care | Source: Ambulatory Visit | Attending: Radiation Oncology | Admitting: Radiation Oncology

## 2023-11-13 ENCOUNTER — Encounter: Payer: Self-pay | Admitting: Radiation Oncology

## 2023-11-13 ENCOUNTER — Encounter: Payer: Self-pay | Admitting: *Deleted

## 2023-11-13 DIAGNOSIS — C61 Malignant neoplasm of prostate: Secondary | ICD-10-CM | POA: Diagnosis present

## 2023-11-13 NOTE — Radiation Completion Notes (Signed)
 Patient Name: Lawrence Dalton, Lawrence Dalton MRN: [id number] Date of Birth: [birth year] Referring Physician: BENJAMIN HERRICK, M.D. Date of Service: 2023-11-13 Radiation Oncologist: Bartholome Ligas, M.D. [facility] [place]                             RADIATION ONCOLOGY END OF TREATMENT NOTE     Diagnosis: C61 Malignant neoplasm of prostate Staging on 2023-05-26: Malignant neoplasm of prostate (HCC) T=cT1c, N=cN0, M=cM0 Intent: Curative     ==========DELIVERED PLANS==========  Prostate Seed Implant Date: 2023-10-15   Plan Name: Prostate Seed Implant Site: Prostate Technique: Radioactive Seed Implant I-125 Mode: Brachytherapy Dose Per Fraction: 145 Gy Prescribed Dose (Delivered / Prescribed): 145 Gy / 145 Gy Prescribed Fxs (Delivered / Prescribed): 1 / 1     ==========ON TREATMENT VISIT DATES========== 2023-10-15     ==========UPCOMING VISITS==========

## 2023-11-18 NOTE — Progress Notes (Signed)
  Radiation Oncology         [phone]) [phone] ________________________________  Name: Bastien Strawser MRN: [id number]  Date: 11/13/2023  DOB: December 29, 1970  3D Planning Note   Prostate Brachytherapy Post-Implant Dosimetry  Diagnosis: 53 y.o. gentleman with Stage T1c adenocarcinoma of the prostate with Gleason score of 3+4, and PSA of 5.74.   Narrative: On a previous date, Duwan Adrian returned following prostate seed implantation for post implant planning. He underwent CT scan complex simulation to delineate the three-dimensional structures of the pelvis and demonstrate the radiation distribution.  Since that time, the seed localization, and complex isodose planning with dose volume histograms have now been completed.  Results:   Prostate Coverage - The dose of radiation delivered to the 90% or more of the prostate gland (D90) was 118.46% of the prescription dose. This exceeds our goal of greater than 90%. Rectal Sparing - The volume of rectal tissue receiving the prescription dose or higher was 0.0 cc. This falls under our thresholds tolerance of 1.0 cc.  Impression: The prostate seed implant appears to show adequate target coverage and appropriate rectal sparing.  Plan:  The patient will continue to follow with urology for ongoing PSA determinations. I would anticipate a high likelihood for local tumor control with minimal risk for rectal morbidity.  ________________________________  Trilby Fujisawa Lorri Rota, M.D.

## 2023-12-22 ENCOUNTER — Encounter: Payer: Self-pay | Admitting: *Deleted

## 2023-12-22 NOTE — Progress Notes (Signed)
 SCP completed and mailed to pt.

## 2024-01-04 ENCOUNTER — Encounter: Payer: Self-pay | Admitting: *Deleted

## 2024-01-04 ENCOUNTER — Inpatient Hospital Stay: Attending: Adult Health | Admitting: *Deleted

## 2024-01-04 DIAGNOSIS — C61 Malignant neoplasm of prostate: Secondary | ICD-10-CM

## 2024-01-04 NOTE — Progress Notes (Signed)
 SCP reviewed and completed. Two identifiers used for this telephone visit. NKDA and meds updated. Pt does smoke 3 cigars weekly and drinks on special occasion.Pt does not get the flu vaccine by choice. Exercise and Nutrition were discussed. Last colonoscopy on 12/23/2021. Next due 12/24/2031. Pt does have hx of sleep apnea and sleeps with a CPAP nightly. Pt only gets up to bathroom at night to urinate x 1.  Frequency during daytime is well-controlled,pt states he urinates every 2-2.5 hours. Pt has first post PSA labs in August at [facility]. A copy of prostate cancer summary has been sent to PCP.

## 2024-03-02 ENCOUNTER — Encounter (HOSPITAL_COMMUNITY): Payer: Self-pay | Admitting: *Deleted
